# Patient Record
Sex: Female | Born: 1975
Health system: Southern US, Community
[De-identification: ages and names within clinical notes are randomized; demographics above are authoritative.]

## PROBLEM LIST (undated history)

## (undated) DIAGNOSIS — R87619 Unspecified abnormal cytological findings in specimens from cervix uteri: Secondary | ICD-10-CM

## (undated) DIAGNOSIS — F32A Depression, unspecified: Secondary | ICD-10-CM

## (undated) HISTORY — PX: ADENOIDECTOMY: SUR15

---

## 2007-06-25 ENCOUNTER — Inpatient Hospital Stay (HOSPITAL_COMMUNITY): Admission: AD | Admit: 2007-06-25 | Discharge: 2007-06-25 | Payer: Self-pay | Admitting: Obstetrics and Gynecology

## 2007-06-28 ENCOUNTER — Inpatient Hospital Stay (HOSPITAL_COMMUNITY): Admission: AD | Admit: 2007-06-28 | Discharge: 2007-06-30 | Payer: Self-pay | Admitting: Obstetrics and Gynecology

## 2010-08-22 NOTE — H&P (Signed)
NAMEMarland Kitchen  PIXIE, BURGENER NO.:  [id number]   MEDICAL RECORD NO.:  [id number]           PATIENT TYPE:   LOCATION:                                 FACILITY:   PHYSICIAN:  Crist Fat. Rivard, M.D. DATE OF BIRTH:  1975-08-19   DATE OF ADMISSION:  07/06/2007  DATE OF DISCHARGE:                              HISTORY & PHYSICAL   [name] is a 35 year old gravida 2, para 0-0-1-0 at 4 weeks who  presents today for induction secondary to proteinuria.  She was seen at  maternity admissions unit from on office visit on June 25, 2007, with  temporal headache, blurry vision, blood pressure 120/80.  She had a PIH  workup that day that was negative and she was sent home to do a 24-hour  urine and have repeat PIH labs.  PIH labs were negative.  However, she  had a 24-hour urine specimen that showed 535 mg of protein in a 24-hour  specimen.  The complicating factor, however, was that this extended  itself over approximately 36 hours with 1500 mL of urine produced.  Creatinine level in the 24-hour specimen was 1907.  The patient had been  3 cm in the office.  Therefore, she was discussed with Dr. Estanislado Pandy and  the decision was made to admit her for induction secondary to  proteinuria.   Pregnancy has been remarkable for:  1. History of abnormal Pap.  2. History of depression.  3. History of ADD.  4. History of migraines.  5. Previous smoker.  6. First trimester bleeding.  7. Some lability of blood pressure in the third trimester.  8. Now proteinuria on a 24-hour specimen.   PRENATAL LABORATORIES:  Blood type is B positive, Rh antibody negative.  VDRL nonreactive.  Rubella titer positive.  Hepatitis B surface antigen  negative.  HIV was nonreactive.  AFP was normal.  GC and chlamydia  cultures were negative in September.  First trimester screen was also  normal.  Pap showed ASCUS, could not exclude high-grade SIL.  Negative  HPV.  Colposcopy was done at 20 weeks and was normal.   The patient had a  Glucola that was elevated.  She had a normal 3-hour GTT.  PIH labs that  were done at 38 weeks were normal.  She also had another set done on  March 20 that were also normal.  Sickle cell test was negative.  Hemoglobin upon entering the practice was 12.3; it was 13.2 at 28 weeks.   HISTORY OF PRESENT PREGNANCY:  The patient entered care at approximately  13 weeks.  First trimester screen was done.  She had some headache and  vomiting at that time.  She was given Phenergan and Vicodin.  She  continued to have some issues with headache.  She had ASCUS on Pap that  could not exclude high-grade SIL, negative HPV.  Sickle cell was done  and was negative.  Colposcopy was done at 20 weeks and was normal.  She  also had an ultrasound at 18 weeks showing normal growth and an anterior  placenta.  Migraines  continued to be an issue for her throughout her  pregnancy and she was referred to several headache clinics.  However,  due to insurance that was not available to the patient.  She saw her  primary physician, Dr. Reuel Boom, for that.  One-hour Glucola was elevated;  3-hour GTT was normal.  Blood pressure at 34 weeks was 140/80.  She was  having some low back pain.  Group B strep culture done and other  cultures were done at 35-36 weeks and were negative.  On March 18 she  presented to the office with a temporal headache, blurry vision, feeling  disoriented and feels had difficulty speaking.  She also had some  tingling in the right hand.  She was sent to maternity admissions unit  for NST, PIH workup.  These were found to be normal and it was likely  determined this was a migraine variant.  She was seen again in the  office on March 19 and had some PIH labs as well as a 24-hour urine  started.  She returned to the office on March 20.  She did report,  though, that this was more of a 36-hour specimen.  PIH labs were within  normal limits.  Uric acid was less than 5, SGOT/SGPT were  normal, but  her urine sample showed 535 mg of protein.  Her cervix was 3 cm, 50% in  the office.  Therefore, the decision was made to admit her on March 21  for induction.   OBSTETRICAL HISTORY:  In 08/28/05 the patient had a 6-week pregnancy  termination.   MEDICAL HISTORY:  In 28-Aug-1997 she had an abnormal Pap required a colposcopy.  Her Paps had been normal until the first of her pregnancy which showed  an ASCUS which could not exclude high-grade SIL, negative HPV and  negative colposcopy.  She had Trichomonas in 28-Aug-1996.  She had a UTI and  pyelonephritis as a child.  She does have a history of depression after  the death of her husband in 08/28/2001 but she has not been on any medication.  She has also been diagnosed with ADD.  She has been a smoker since 08-28-01.  She has no medication allergies.   SURGICAL HISTORY:  Includes a TAB, wisdom teeth removed in Aug 29, 1995 and  adenoids at age 87.   FAMILY HISTORY:  Her maternal grandmother had hypertension and is now  deceased.  Her maternal grandmother also had anemia.  Maternal  grandmother had non-insulin-dependent diabetes.  Maternal grandmother  had thyroid disease and chronic renal disease.  Paternal grandfather had  Alzheimer's and migraines.   GENETIC HISTORY:  Unremarkable.   SOCIAL HISTORY:  The patient is single.  Father of the baby has been  supportive through the pregnancy.  The patient is [place] of  the [place] faith.  She is high-school educated.  She is employed as a  Scientist, physiological.  Her partner is also high-school educated.  He is a  Community education officer.  The patient has been followed by the physician  service at [facility].  She denies any alcohol, drug or tobacco  use during this pregnancy.   PHYSICAL EXAMINATION:  Blood pressure in the office this past day was  140/86, other vital signs were stable.  HEENT:  Within normal limits.  LUNGS:  Bilateral breath sounds are clear.  HEART:  Regular rate and rhythm  without murmur.  BREASTS:  Soft and nontender.  ABDOMEN:  Fundal height is approximately 38 cm.  Estimated fetal weight  is 7 to 7-and-a-half pounds.  Uterine contractions have been irregular  and mild.  Fetal heart rate has been in the 140s by Doppler.  EXTREMITIES:  Deep tendon reflexes are 2+ without clonus.  There is a 1+  edema noted in the lower extremities.  PELVIC EXAM:  By the physician in the office this week was 3 cm, 50%,  vertex at a -2 station.   IMPRESSION:  1. Intrauterine pregnancy at 39 weeks.  2. Proteinuria on a 24-hour-plus specimen.  3. Some lability of blood pressure in the third trimester.  4. Group B streptococcus negative.   PLAN:  1. Admit to [facility] per consult with Dr. Silverio Lay as attending physician.  2. Routine physician orders for induction.  3. Will repeat PIH labs on admission.  4. Pain medication as needed.  5. Low-dose Pitocin for induction.      Erika Galloway, C.N.M.      Crist Fat Rivard, M.D.  Electronically Signed    VLL/MEDQ  D:  06/28/2007  T:  06/28/2007  Job:  [id number]

## 2011-01-01 LAB — URINALYSIS, ROUTINE W REFLEX MICROSCOPIC
Bilirubin Urine: NEGATIVE
Glucose, UA: NEGATIVE
Glucose, UA: NEGATIVE
Ketones, ur: 15 — AB
Leukocytes, UA: NEGATIVE
Nitrite: NEGATIVE
Protein, ur: 30 — AB
Protein, ur: NEGATIVE
Urobilinogen, UA: 0.2

## 2011-01-01 LAB — CBC
HCT: 28.9 — ABNORMAL LOW
HCT: 34.5 — ABNORMAL LOW
Hemoglobin: 12.2
MCHC: 35
MCHC: 35.3
MCV: 92.6
MCV: 92.8
Platelets: 195
Platelets: 242
RBC: 3.73 — ABNORMAL LOW
RDW: 12.9
WBC: 5.2

## 2011-01-01 LAB — RPR: RPR Ser Ql: NONREACTIVE

## 2011-01-01 LAB — URIC ACID
Uric Acid, Serum: 4.1
Uric Acid, Serum: 4.2

## 2011-01-01 LAB — COMPREHENSIVE METABOLIC PANEL
AST: 13
Alkaline Phosphatase: 106
BUN: 2 — ABNORMAL LOW
CO2: 21
CO2: 21
Calcium: 8.5
Chloride: 109
Creatinine, Ser: 0.54
Creatinine, Ser: 0.57
GFR calc Af Amer: >60
GFR calc non Af Amer: >60
GFR calc non Af Amer: >60
Potassium: 3.6
Total Bilirubin: 0.7

## 2011-01-01 LAB — URINE MICROSCOPIC-ADD ON

## 2018-06-17 DIAGNOSIS — Z6826 Body mass index (BMI) 26.0-26.9, adult: Secondary | ICD-10-CM | POA: Diagnosis not present

## 2018-06-17 DIAGNOSIS — M545 Low back pain: Secondary | ICD-10-CM | POA: Diagnosis not present

## 2018-07-28 DIAGNOSIS — G43919 Migraine, unspecified, intractable, without status migrainosus: Secondary | ICD-10-CM | POA: Diagnosis not present

## 2018-10-22 DIAGNOSIS — G43919 Migraine, unspecified, intractable, without status migrainosus: Secondary | ICD-10-CM | POA: Diagnosis not present

## 2018-10-22 DIAGNOSIS — Z6826 Body mass index (BMI) 26.0-26.9, adult: Secondary | ICD-10-CM | POA: Diagnosis not present

## 2018-10-22 DIAGNOSIS — F331 Major depressive disorder, recurrent, moderate: Secondary | ICD-10-CM | POA: Diagnosis not present

## 2018-10-22 DIAGNOSIS — F900 Attention-deficit hyperactivity disorder, predominantly inattentive type: Secondary | ICD-10-CM | POA: Diagnosis not present

## 2018-10-27 MED FILL — AMPHETAMINE SALTS 30 MG TAB: 30 | 30 days supply | Qty: 60 | Fill #0

## 2018-11-21 MED FILL — AMPHETAMINE SALTS 30 MG TAB: 30 | 30 days supply | Qty: 30 | Fill #0

## 2018-11-21 MED FILL — AMPHETAMINE SALTS 30 MG TAB: 30 | 30 days supply | Qty: 60 | Fill #0

## 2018-11-25 MED FILL — predniSONE 50 MG TABS: 50 | 5 days supply | Qty: 5 | Fill #0

## 2018-12-04 ENCOUNTER — Other Ambulatory Visit: Payer: Self-pay | Admitting: *Deleted

## 2018-12-04 DIAGNOSIS — Z20822 Contact with and (suspected) exposure to covid-19: Secondary | ICD-10-CM

## 2018-12-05 LAB — NOVEL CORONAVIRUS, NAA: SARS-CoV-2, NAA: NOT DETECTED

## 2018-12-08 ENCOUNTER — Encounter: Payer: Self-pay | Admitting: *Deleted

## 2018-12-08 NOTE — Progress Notes (Signed)
Unable to contact patient with covid results. Contact numbers are incorrect

## 2018-12-24 DIAGNOSIS — Z6827 Body mass index (BMI) 27.0-27.9, adult: Secondary | ICD-10-CM | POA: Diagnosis not present

## 2018-12-24 DIAGNOSIS — Z131 Encounter for screening for diabetes mellitus: Secondary | ICD-10-CM | POA: Diagnosis not present

## 2018-12-24 DIAGNOSIS — Z01419 Encounter for gynecological examination (general) (routine) without abnormal findings: Secondary | ICD-10-CM | POA: Diagnosis not present

## 2018-12-24 DIAGNOSIS — Z13220 Encounter for screening for lipoid disorders: Secondary | ICD-10-CM | POA: Diagnosis not present

## 2018-12-24 DIAGNOSIS — Z975 Presence of (intrauterine) contraceptive device: Secondary | ICD-10-CM | POA: Diagnosis not present

## 2018-12-24 DIAGNOSIS — Z1330 Encounter for screening examination for mental health and behavioral disorders, unspecified: Secondary | ICD-10-CM | POA: Diagnosis not present

## 2018-12-24 DIAGNOSIS — Z1239 Encounter for other screening for malignant neoplasm of breast: Secondary | ICD-10-CM | POA: Diagnosis not present

## 2018-12-24 MED FILL — AMPHETAMINE SALTS 30 MG TAB: 30 | 30 days supply | Qty: 30 | Fill #0

## 2018-12-24 MED FILL — AMPHETAMINE SALTS 30 MG TAB: 30 | 30 days supply | Qty: 60 | Fill #0

## 2019-01-21 MED FILL — AMPHETAMINE SALTS 30 MG TAB: 30 | 30 days supply | Qty: 60 | Fill #0

## 2019-01-29 DIAGNOSIS — R87612 Low grade squamous intraepithelial lesion on cytologic smear of cervix (LGSIL): Secondary | ICD-10-CM | POA: Diagnosis not present

## 2019-01-29 DIAGNOSIS — Z6826 Body mass index (BMI) 26.0-26.9, adult: Secondary | ICD-10-CM | POA: Diagnosis not present

## 2019-01-29 DIAGNOSIS — F331 Major depressive disorder, recurrent, moderate: Secondary | ICD-10-CM | POA: Diagnosis not present

## 2019-01-29 DIAGNOSIS — G43919 Migraine, unspecified, intractable, without status migrainosus: Secondary | ICD-10-CM | POA: Diagnosis not present

## 2019-02-04 ENCOUNTER — Other Ambulatory Visit (HOSPITAL_COMMUNITY)
Admission: RE | Admit: 2019-02-04 | Discharge: 2019-02-04 | Disposition: A | Discharge status: Home / Self Care | Payer: 59 | Source: Ambulatory Visit | Attending: Obstetrics and Gynecology | Admitting: Obstetrics and Gynecology

## 2019-02-04 DIAGNOSIS — Z01419 Encounter for gynecological examination (general) (routine) without abnormal findings: Secondary | ICD-10-CM | POA: Insufficient documentation

## 2019-02-06 ENCOUNTER — Other Ambulatory Visit (HOSPITAL_COMMUNITY)
Admission: RE | Admit: 2019-02-06 | Discharge: 2019-02-06 | Disposition: A | Discharge status: Home / Self Care | Payer: 59 | Source: Ambulatory Visit | Attending: Obstetrics and Gynecology | Admitting: Obstetrics and Gynecology

## 2019-02-06 DIAGNOSIS — Z01419 Encounter for gynecological examination (general) (routine) without abnormal findings: Secondary | ICD-10-CM | POA: Insufficient documentation

## 2019-02-06 LAB — LIPID PANEL
Cholesterol: 249 mg/dL — ABNORMAL HIGH (ref 0–200)
HDL: 90 mg/dL (ref >40)
LDL Cholesterol: 124 mg/dL — ABNORMAL HIGH (ref 0–99)
Total CHOL/HDL Ratio: 2.8 RATIO
Triglycerides: 177 mg/dL — ABNORMAL HIGH (ref <150)
VLDL: 35 mg/dL (ref 0–40)

## 2019-02-06 LAB — HEMOGLOBIN A1C
Hgb A1c MFr Bld: 4.7 % — ABNORMAL LOW (ref 4.8–5.6)
Mean Plasma Glucose: 88.19 mg/dL

## 2019-02-06 LAB — TSH: TSH: 1.098 u[IU]/mL (ref 0.350–4.500)

## 2019-02-12 MED FILL — AMPHETAMINE SALTS 30 MG TAB: 30 | 30 days supply | Qty: 30 | Fill #0

## 2019-02-20 MED FILL — AMPHETAMINE SALTS 30 MG TAB: 30 | 60 days supply | Qty: 60 | Fill #0

## 2019-03-12 MED FILL — AMPHETAMINE SALTS 30 MG TAB: 30 | 90 days supply | Qty: 90 | Fill #0

## 2019-04-14 MED FILL — AMPHETAMINE SALTS 30 MG TAB: 30 | 30 days supply | Qty: 60 | Fill #0

## 2019-04-28 ENCOUNTER — Other Ambulatory Visit (HOSPITAL_COMMUNITY): Payer: Self-pay | Admitting: Family Medicine

## 2019-04-28 ENCOUNTER — Other Ambulatory Visit (HOSPITAL_COMMUNITY): Payer: Self-pay | Admitting: Obstetrics and Gynecology

## 2019-04-28 DIAGNOSIS — Z1231 Encounter for screening mammogram for malignant neoplasm of breast: Secondary | ICD-10-CM

## 2019-05-04 ENCOUNTER — Other Ambulatory Visit: Payer: Self-pay

## 2019-05-04 ENCOUNTER — Ambulatory Visit (HOSPITAL_COMMUNITY)
Admission: RE | Admit: 2019-05-04 | Discharge: 2019-05-04 | Disposition: A | Discharge status: Home / Self Care | Payer: 59 | Source: Ambulatory Visit | Attending: Obstetrics and Gynecology | Admitting: Obstetrics and Gynecology

## 2019-05-04 DIAGNOSIS — Z1231 Encounter for screening mammogram for malignant neoplasm of breast: Secondary | ICD-10-CM | POA: Insufficient documentation

## 2019-05-05 ENCOUNTER — Other Ambulatory Visit (HOSPITAL_COMMUNITY): Payer: Self-pay | Admitting: Obstetrics and Gynecology

## 2019-05-05 DIAGNOSIS — F331 Major depressive disorder, recurrent, moderate: Secondary | ICD-10-CM | POA: Diagnosis not present

## 2019-05-05 DIAGNOSIS — F900 Attention-deficit hyperactivity disorder, predominantly inattentive type: Secondary | ICD-10-CM | POA: Diagnosis not present

## 2019-05-05 DIAGNOSIS — G43919 Migraine, unspecified, intractable, without status migrainosus: Secondary | ICD-10-CM | POA: Diagnosis not present

## 2019-05-06 ENCOUNTER — Other Ambulatory Visit (HOSPITAL_COMMUNITY): Payer: Self-pay | Admitting: Obstetrics and Gynecology

## 2019-05-06 DIAGNOSIS — R928 Other abnormal and inconclusive findings on diagnostic imaging of breast: Secondary | ICD-10-CM

## 2019-05-19 ENCOUNTER — Ambulatory Visit (HOSPITAL_COMMUNITY)
Admission: RE | Admit: 2019-05-19 | Discharge: 2019-05-19 | Disposition: A | Discharge status: Home / Self Care | Payer: 59 | Source: Ambulatory Visit | Attending: Obstetrics and Gynecology | Admitting: Obstetrics and Gynecology

## 2019-05-19 ENCOUNTER — Other Ambulatory Visit: Payer: Self-pay

## 2019-05-19 DIAGNOSIS — N6321 Unspecified lump in the left breast, upper outer quadrant: Secondary | ICD-10-CM | POA: Diagnosis not present

## 2019-05-19 DIAGNOSIS — R928 Other abnormal and inconclusive findings on diagnostic imaging of breast: Secondary | ICD-10-CM | POA: Diagnosis not present

## 2019-06-10 MED FILL — AMPHETAMINE SALTS 30 MG TAB: 30 | 90 days supply | Qty: 180 | Fill #0

## 2019-06-10 MED FILL — AMPHETAMINE SALTS 30 MG TAB: 30 | 90 days supply | Qty: 90 | Fill #0

## 2019-07-22 MED FILL — PENICILLIN VK 500 MG TABLET: 500 | 8 days supply | Qty: 30 | Fill #0

## 2019-09-24 DIAGNOSIS — G43919 Migraine, unspecified, intractable, without status migrainosus: Secondary | ICD-10-CM | POA: Diagnosis not present

## 2019-09-24 DIAGNOSIS — F331 Major depressive disorder, recurrent, moderate: Secondary | ICD-10-CM | POA: Diagnosis not present

## 2019-09-24 DIAGNOSIS — F900 Attention-deficit hyperactivity disorder, predominantly inattentive type: Secondary | ICD-10-CM | POA: Diagnosis not present

## 2019-09-24 DIAGNOSIS — Z6826 Body mass index (BMI) 26.0-26.9, adult: Secondary | ICD-10-CM | POA: Diagnosis not present

## 2019-09-24 MED FILL — AMPHETAMINE-DEXTROAMPHETAMI: 10 | 90 days supply | Qty: 90 | Fill #0

## 2019-09-24 MED FILL — AMPHETAMINE SALTS 30 MG TAB: 30 | 90 days supply | Qty: 180 | Fill #0

## 2019-09-24 MED FILL — ESCITALOPRAM 10 MG TABLET: 10 | 30 days supply | Qty: 30 | Fill #0

## 2020-01-05 DIAGNOSIS — R202 Paresthesia of skin: Secondary | ICD-10-CM | POA: Diagnosis not present

## 2020-01-25 ENCOUNTER — Other Ambulatory Visit (HOSPITAL_COMMUNITY): Payer: Self-pay | Admitting: Physician Assistant

## 2020-01-25 DIAGNOSIS — F900 Attention-deficit hyperactivity disorder, predominantly inattentive type: Secondary | ICD-10-CM | POA: Diagnosis not present

## 2020-01-25 DIAGNOSIS — G43919 Migraine, unspecified, intractable, without status migrainosus: Secondary | ICD-10-CM | POA: Diagnosis not present

## 2020-01-25 DIAGNOSIS — Z6828 Body mass index (BMI) 28.0-28.9, adult: Secondary | ICD-10-CM | POA: Diagnosis not present

## 2020-01-25 DIAGNOSIS — Z975 Presence of (intrauterine) contraceptive device: Secondary | ICD-10-CM | POA: Diagnosis not present

## 2020-01-25 DIAGNOSIS — N6320 Unspecified lump in the left breast, unspecified quadrant: Secondary | ICD-10-CM | POA: Diagnosis not present

## 2020-01-25 DIAGNOSIS — Z01419 Encounter for gynecological examination (general) (routine) without abnormal findings: Secondary | ICD-10-CM | POA: Diagnosis not present

## 2020-01-25 DIAGNOSIS — Z6827 Body mass index (BMI) 27.0-27.9, adult: Secondary | ICD-10-CM | POA: Diagnosis not present

## 2020-01-25 DIAGNOSIS — F331 Major depressive disorder, recurrent, moderate: Secondary | ICD-10-CM | POA: Diagnosis not present

## 2020-01-25 DIAGNOSIS — Z1151 Encounter for screening for human papillomavirus (HPV): Secondary | ICD-10-CM | POA: Diagnosis not present

## 2020-01-25 MED FILL — AMPHETAMINE SALTS 30 MG TAB: 30 | 90 days supply | Qty: 180 | Fill #0

## 2020-01-28 DIAGNOSIS — Z20828 Contact with and (suspected) exposure to other viral communicable diseases: Secondary | ICD-10-CM | POA: Diagnosis not present

## 2020-02-02 DIAGNOSIS — Z20828 Contact with and (suspected) exposure to other viral communicable diseases: Secondary | ICD-10-CM | POA: Diagnosis not present

## 2020-02-10 ENCOUNTER — Other Ambulatory Visit (HOSPITAL_COMMUNITY): Payer: Self-pay | Admitting: Obstetrics and Gynecology

## 2020-02-10 DIAGNOSIS — N6320 Unspecified lump in the left breast, unspecified quadrant: Secondary | ICD-10-CM

## 2020-02-12 DIAGNOSIS — H5213 Myopia, bilateral: Secondary | ICD-10-CM | POA: Diagnosis not present

## 2020-03-21 DIAGNOSIS — R87613 High grade squamous intraepithelial lesion on cytologic smear of cervix (HGSIL): Secondary | ICD-10-CM | POA: Diagnosis not present

## 2020-03-21 DIAGNOSIS — N871 Moderate cervical dysplasia: Secondary | ICD-10-CM | POA: Diagnosis not present

## 2020-03-29 ENCOUNTER — Ambulatory Visit (HOSPITAL_COMMUNITY)
Admission: RE | Admit: 2020-03-29 | Discharge: 2020-03-29 | Disposition: A | Discharge status: Home / Self Care | Payer: 59 | Source: Ambulatory Visit | Attending: Obstetrics and Gynecology | Admitting: Obstetrics and Gynecology

## 2020-03-29 ENCOUNTER — Other Ambulatory Visit (HOSPITAL_COMMUNITY): Payer: Self-pay | Admitting: Obstetrics and Gynecology

## 2020-03-29 ENCOUNTER — Other Ambulatory Visit: Payer: Self-pay

## 2020-03-29 DIAGNOSIS — N6320 Unspecified lump in the left breast, unspecified quadrant: Secondary | ICD-10-CM

## 2020-03-29 DIAGNOSIS — R928 Other abnormal and inconclusive findings on diagnostic imaging of breast: Secondary | ICD-10-CM

## 2020-03-29 DIAGNOSIS — N6321 Unspecified lump in the left breast, upper outer quadrant: Secondary | ICD-10-CM | POA: Diagnosis not present

## 2020-03-29 DIAGNOSIS — N6001 Solitary cyst of right breast: Secondary | ICD-10-CM | POA: Diagnosis not present

## 2020-04-07 ENCOUNTER — Other Ambulatory Visit: Payer: Self-pay

## 2020-04-07 ENCOUNTER — Ambulatory Visit (HOSPITAL_COMMUNITY)
Admission: RE | Admit: 2020-04-07 | Discharge: 2020-04-07 | Disposition: A | Discharge status: Home / Self Care | Payer: 59 | Source: Ambulatory Visit | Attending: Obstetrics and Gynecology | Admitting: Obstetrics and Gynecology

## 2020-04-07 ENCOUNTER — Other Ambulatory Visit (HOSPITAL_COMMUNITY): Payer: Self-pay | Admitting: Obstetrics and Gynecology

## 2020-04-07 ENCOUNTER — Encounter (HOSPITAL_COMMUNITY): Payer: Self-pay

## 2020-04-07 DIAGNOSIS — R928 Other abnormal and inconclusive findings on diagnostic imaging of breast: Secondary | ICD-10-CM

## 2020-04-07 DIAGNOSIS — N6321 Unspecified lump in the left breast, upper outer quadrant: Secondary | ICD-10-CM | POA: Diagnosis not present

## 2020-04-07 DIAGNOSIS — D0512 Intraductal carcinoma in situ of left breast: Secondary | ICD-10-CM | POA: Diagnosis not present

## 2020-04-07 DIAGNOSIS — D242 Benign neoplasm of left breast: Secondary | ICD-10-CM | POA: Diagnosis not present

## 2020-04-07 MED ORDER — SODIUM BICARBONATE 4.2 % IV SOLN
INTRAVENOUS | Status: AC
  Administered 2020-04-07: 2.5 meq
  Filled 2020-04-07: qty 10

## 2020-04-07 MED ORDER — LIDOCAINE HCL (PF) 1 % IJ SOLN
10.0000 mL | Freq: Once | INTRAMUSCULAR | Status: AC
  Administered 2020-04-07: 10 mL via INTRADERMAL

## 2020-04-07 MED ORDER — LIDOCAINE-EPINEPHRINE (PF) 1 %-1:200000 IJ SOLN
10.0000 mL | Freq: Once | INTRAMUSCULAR | Status: AC
  Administered 2020-04-07: 10 mL via INTRADERMAL

## 2020-04-07 NOTE — Discharge Instructions (Signed)
Breast Biopsy, Care After These instructions give you information about caring for yourself after your procedure. Your doctor may also give you more specific instructions. Call your doctor if you have any problems or questions after your procedure. What can I expect after the procedure? After your procedure, it is common to have:  Bruising on your breast.  Numbness, tingling, or pain near your biopsy site. Follow these instructions at home: Medicines  Take over-the-counter and prescription medicines only as told by your doctor.  Do not drive for 24 hours if you were given a medicine to help you relax (sedative) during your procedure.  Do not drink alcohol while taking pain medicine.  Do not drive or use heavy machinery while taking prescription pain medicine. Biopsy site care      Follow instructions from your doctor about how to take care of your cut from surgery (incision) or your puncture area. Make sure you: ? Wash your hands with soap and water before you change your bandage (dressing). If you cannot use soap and water, use hand sanitizer. ? Change your bandage as told by your doctor. ? Leave stitches (sutures), skin glue, or skin tape (adhesive strips) in place. They may need to stay in place for 2 weeks or longer. If tape strips get loose and curl up, you may trim the loose edges. Do not remove tape strips completely unless your doctor says it is okay.  If you have stitches, keep them dry when you take a bath or a shower.  Check your cut or puncture area every day for signs of infection. Check for: ? Redness, swelling, or pain. ? Fluid or blood. ? Warmth. ? Pus or a bad smell.  Protect the biopsy area. Do not let the area get bumped. Activity  If you had a cut during your procedure, avoid activities that could pull your cut open. These include: ? Stretching. ? Reaching over your head. ? Exercise. ? Sports. ? Lifting anything that weighs more than 3 lb (1.4  kg).  Return to your normal activities as told by your doctor. Ask your doctor what activities are safe for you. Managing pain, stiffness, and swelling If told, put ice on the biopsy site to relieve swelling:  Put ice in a plastic bag.  Place a towel between your skin and the bag.  Leave the ice on for 20 minutes, 2-3 times a day. General instructions  Continue your normal diet.  Wear a good support bra for as long as told by your doctor.  Get checked for extra fluid around your lymph nodes (lymphedema) as often as told by your doctor.  Keep all follow-up visits as told by your doctor. This is important. Contact a doctor if:  You notice any of the following at the biopsy site: ? More redness, swelling, or pain. ? More fluid or blood coming from the site. ? The site feels warm to the touch. ? Pus or a bad smell coming from the site. ? The site breaks open after the stitches or skin tape strips have been removed.  You have a rash.  You have a fever. Get help right away if:  You have more bleeding from the biopsy site. Get help right away if bleeding is more than a small spot.  You have trouble breathing.  You have red streaks around the biopsy site. Summary  After your procedure, it is common to have bruising, numbness, tingling, or pain near the biopsy site.  Do not drive   or use heavy machinery while taking prescription pain medicine.  Wear a good support bra for as long as told by your doctor.  If you had a cut during your procedure, avoid activities that may pull the cut open. Ask your doctor what activities are safe for you. This information is not intended to replace advice given to you by your health care provider. Make sure you discuss any questions you have with your health care provider. Document Revised: 09/12/2017 Document Reviewed: 09/12/2017 Elsevier Patient Education  2020 Elsevier Inc.  

## 2020-04-07 NOTE — Sedation Documentation (Signed)
PT tolerated left breast biopsy well today with NAD noted. PT verbalized understanding of discharge instructions. PT ambulated back to the mammogram area this time.  

## 2020-04-11 LAB — SURGICAL PATHOLOGY

## 2020-04-19 DIAGNOSIS — Z8616 Personal history of COVID-19: Secondary | ICD-10-CM

## 2020-04-19 HISTORY — DX: Personal history of COVID-19: Z86.16

## 2020-04-20 ENCOUNTER — Other Ambulatory Visit (HOSPITAL_COMMUNITY): Payer: Self-pay | Admitting: Physician Assistant

## 2020-04-20 MED FILL — DEXTROAMP-AMPHETAMIN 20 MG: 20 | 90 days supply | Qty: 90 | Fill #0

## 2020-05-03 ENCOUNTER — Other Ambulatory Visit (HOSPITAL_COMMUNITY): Payer: Self-pay | Admitting: General Surgery

## 2020-05-03 ENCOUNTER — Ambulatory Visit: Payer: Self-pay | Admitting: General Surgery

## 2020-05-03 DIAGNOSIS — D0502 Lobular carcinoma in situ of left breast: Secondary | ICD-10-CM

## 2020-05-06 ENCOUNTER — Other Ambulatory Visit: Payer: Self-pay | Admitting: General Surgery

## 2020-05-06 DIAGNOSIS — D0502 Lobular carcinoma in situ of left breast: Secondary | ICD-10-CM

## 2020-05-09 ENCOUNTER — Other Ambulatory Visit (HOSPITAL_COMMUNITY): Payer: Self-pay | Admitting: Obstetrics & Gynecology

## 2020-05-09 MED FILL — metroNIDAZOLE 500 MG TABS: 500 | 7 days supply | Qty: 14 | Fill #0

## 2020-05-11 ENCOUNTER — Ambulatory Visit (HOSPITAL_COMMUNITY)
Admission: RE | Admit: 2020-05-11 | Discharge: 2020-05-11 | Disposition: A | Discharge status: Home / Self Care | Payer: No Typology Code available for payment source | Source: Ambulatory Visit | Attending: General Surgery | Admitting: General Surgery

## 2020-05-11 ENCOUNTER — Other Ambulatory Visit: Payer: Self-pay

## 2020-05-11 DIAGNOSIS — D0502 Lobular carcinoma in situ of left breast: Secondary | ICD-10-CM | POA: Diagnosis present

## 2020-05-11 MED ORDER — GADOBUTROL 1 MMOL/ML IV SOLN
7.0000 mL | Freq: Once | INTRAVENOUS | Status: AC | PRN
  Administered 2020-05-11: 7 mL via INTRAVENOUS

## 2020-05-16 ENCOUNTER — Other Ambulatory Visit: Payer: Self-pay

## 2020-05-16 ENCOUNTER — Encounter (HOSPITAL_BASED_OUTPATIENT_CLINIC_OR_DEPARTMENT_OTHER): Payer: Self-pay | Admitting: General Surgery

## 2020-05-17 ENCOUNTER — Encounter (HOSPITAL_COMMUNITY): Payer: Self-pay | Admitting: Hematology

## 2020-05-17 ENCOUNTER — Inpatient Hospital Stay (HOSPITAL_COMMUNITY): Payer: No Typology Code available for payment source | Attending: Hematology | Admitting: Hematology

## 2020-05-17 VITALS — BP 130/82 | HR 88 | Temp 97.2°F | Resp 17 | Ht 66.0 in | Wt 174.4 lb

## 2020-05-17 DIAGNOSIS — Z79899 Other long term (current) drug therapy: Secondary | ICD-10-CM | POA: Insufficient documentation

## 2020-05-17 DIAGNOSIS — Z8616 Personal history of COVID-19: Secondary | ICD-10-CM | POA: Diagnosis not present

## 2020-05-17 DIAGNOSIS — F32A Depression, unspecified: Secondary | ICD-10-CM | POA: Insufficient documentation

## 2020-05-17 DIAGNOSIS — D0502 Lobular carcinoma in situ of left breast: Secondary | ICD-10-CM | POA: Insufficient documentation

## 2020-05-17 NOTE — Progress Notes (Signed)
[facility] [address], [place] [address]   Patient Care Team: Caryl Bis, MD as PCP - General (Family Medicine)  CHIEF COMPLAINTS/PURPOSE OF CONSULTATION:  Newly diagnosed left breast cancer  HISTORY OF PRESENTING ILLNESS:  Erika Galloway 45 y.o. female is here because of recent diagnosis of left breast lobular carcinoma in situ, at the request of Dr. Autumn Messing. She is scheduled for a lumpectomy on 05/23/2020. She had a screening mammogram which showed an 8 mm area of distortion in upper outer quadrant of left breast.  Today she reports feeling well. This is her first breast biopsy and did not feel the mass prior to the mammogram. She denies having any changes in her overall health recently. She is still menstruating.  She denies any family history of cancer on her mother's side; she is not in touch with her father's side of the family.  I reviewed her records extensively and collaborated the history with the patient.  SUMMARY OF ONCOLOGIC HISTORY: Oncology History   No history exists.    In terms of breast cancer risk profile:  She menarched at early age of 44 and is still menstruating.  She had 2 pregnancies, her first child was born at age 19.  She has received birth control pills for approximately 10 years with IUD (Paragard) .  She was never exposed to fertility medications or hormone replacement therapy.  She has negative family history of Breast/GYN/GI cancer.  MEDICAL HISTORY:  Past Medical History:  Diagnosis Date  . Abnormal Pap smear of cervix   . Depression   . History of COVID-19 04/19/2020    SURGICAL HISTORY: Past Surgical History:  Procedure Laterality Date  . ADENOIDECTOMY      SOCIAL HISTORY: Social History   Socioeconomic History  . Marital status: Married    Spouse name: Not on file  . Number of children: Not on file  . Years of education: Not on file  . Highest education level: Not on file  Occupational History   . Not on file  Tobacco Use  . Smoking status: Never Smoker  . Smokeless tobacco: Never Used  Vaping Use  . Vaping Use: Never used  Substance and Sexual Activity  . Alcohol use: Never  . Drug use: Never  . Sexual activity: Not on file  Other Topics Concern  . Not on file  Social History Narrative  . Not on file   Social Determinants of Health   Financial Resource Strain: Low Risk   . Difficulty of Paying Living Expenses: Not very hard  Food Insecurity: No Food Insecurity  . Worried About Charity fundraiser in the Last Year: Never true  . Ran Out of Food in the Last Year: Never true  Transportation Needs: No Transportation Needs  . Lack of Transportation (Medical): No  . Lack of Transportation (Non-Medical): No  Physical Activity: Inactive  . Days of Exercise per Week: 0 days  . Minutes of Exercise per Session: 0 min  Stress: No Stress Concern Present  . Feeling of Stress : Only a little  Social Connections: Moderately Integrated  . Frequency of Communication with Friends and Family: More than three times a week  . Frequency of Social Gatherings with Friends and Family: Three times a week  . Attends Religious Services: 1 to 4 times per year  . Active Member of Clubs or Organizations: No  . Attends Archivist Meetings: Never  . Marital Status: Married  Intimate Partner Violence: Not At Risk  . Fear of Current or Ex-Partner: No  . Emotionally Abused: No  . Physically Abused: No  . Sexually Abused: No    FAMILY HISTORY: No family history on file.  ALLERGIES:  has No Known Allergies.  MEDICATIONS:  Current Outpatient Medications  Medication Sig Dispense Refill  . amphetamine-dextroamphetamine (ADDERALL) 30 MG tablet     . metroNIDAZOLE (FLAGYL) 500 MG tablet Take 500 mg by mouth 2 (two) times daily.     No current facility-administered medications for this visit.    REVIEW OF SYSTEMS:   Review of Systems  Constitutional: Positive for fatigue (90%).  Negative for appetite change.  All other systems reviewed and are negative.   PHYSICAL EXAMINATION: ECOG PERFORMANCE STATUS: 0 - Asymptomatic  Vitals:   05/17/20 0822  BP: 130/82  Pulse: 88  Resp: 17  Temp: (!) 97.2 F (36.2 C)  SpO2: 100%   Filed Weights   05/17/20 0822  Weight: 174 lb 6.4 oz (79.1 kg)   Physical Exam Vitals reviewed.  Constitutional:      Appearance: Normal appearance.  Cardiovascular:     Rate and Rhythm: Normal rate and regular rhythm.     Pulses: Normal pulses.     Heart sounds: Normal heart sounds.  Pulmonary:     Effort: Pulmonary effort is normal.     Breath sounds: Normal breath sounds.  Chest:  Breasts:     Right: Normal. No swelling, bleeding, inverted nipple, mass, nipple discharge, skin change, tenderness, axillary adenopathy or supraclavicular adenopathy.     Left: Normal. No swelling, bleeding, inverted nipple, mass, nipple discharge, skin change, tenderness, axillary adenopathy or supraclavicular adenopathy.    Abdominal:     Palpations: Abdomen is soft. There is no hepatomegaly or mass.     Tenderness: There is no abdominal tenderness.  Musculoskeletal:     Right lower leg: No edema.     Left lower leg: No edema.  Lymphadenopathy:     Upper Body:     Right upper body: No supraclavicular, axillary or pectoral adenopathy.     Left upper body: No supraclavicular, axillary or pectoral adenopathy.  Neurological:     General: No focal deficit present.     Mental Status: She is alert and oriented to person, place, and time.  Psychiatric:        Mood and Affect: Mood normal.        Behavior: Behavior normal.      LABORATORY DATA:  I have reviewed the data as listed Recent Results (from the past 2160 hour(s))  Surgical pathology     Status: None   Collection Time: 04/07/20  1:44 PM  Result Value Ref Range   SURGICAL PATHOLOGY      SURGICAL PATHOLOGY CASE: [id number] PATIENT: Erika Galloway Surgical Pathology  Report     Clinical History: 8 mm  oval mass     FINAL MICROSCOPIC DIAGNOSIS:  A. BREAST, 1:00, MASS, LEFT, BIOPSY: - Lobular carcinoma in situ, involving a fibroadenoma, see comment     COMMENT:  LCIS measures 0.6 cm in greatest linear dimension.  Dr. Tresa Goldfarb reviewed the case and concurs with the diagnosis.  Erika Galloway was notified on 04/11/2020.    GROSS DESCRIPTION:  A: Received in formalin, time in formalin 1:30 PM and cold ischemic time less than 1 minute, are 5 cores of soft tan-yellow tissue which measure 1.5 to 1.7 cm in length and each 0.2 cm in diameter.  The  specimen is submitted in toto.  [facility] 04/07/2020)   Final Diagnosis performed by Jaquita Folds, MD.   Electronically signed 04/11/2020 Technical component performed at [facility] [address] [place], [place] [address].  Professional component performed at [facility], [place] [address], [place], [place] [address].  Immunohistochemistry Technical component (if applicable) was performed at [facility]. [address] [place], [place], [place] [address].   IMMUNOHISTOCHEMISTRY DISCLAIMER (if applicable): Some of these immunohistochemical stains may have been developed and the performance characteristics determine by [facility]. Some may not have been cleared or approved by the U.S. Food and Drug Administration. The FDA has determined that such clearance or approval is not necessary. This test is used for clinical purposes. It should not be regarded as investigational or for research. This laboratory is certified under the [place] (CLIA-88) as qualified to perform high complexity clinical laboratory testing.  The controls stained appropriately.     RADIOGRAPHIC STUDIES: I have personally reviewed the radiological reports and agreed with the findings in the report. MR BREAST  BILATERAL W Tiro CAD  Result Date: 05/12/2020 CLINICAL DATA:  Lobular carcinoma in situ involving a fibroadenoma in the 1 o'clock position of the left breast on ultrasound-guided core needle biopsy performed on 04/07/2020. Bilateral breast MRI and high risk screening MRI annually thereafter was recommended due to the patient's diagnosis of LCIS, which carries an elevated risk for breast cancer development in either breast. LABS:  None obtained on site today. EXAM: BILATERAL BREAST MRI WITH AND WITHOUT CONTRAST TECHNIQUE: Multiplanar, multisequence MR images of both breasts were obtained prior to and following the intravenous administration of 7 ml of Gadavist Three-dimensional MR images were rendered by post-processing of the original MR data on an independent workstation. The three-dimensional MR images were interpreted, and findings are reported in the following complete MRI report for this study. Three dimensional images were evaluated at the independent interpreting workstation using the DynaCAD thin client. COMPARISON:  Bilateral diagnostic mammogram and bilateral breast ultrasound dated 03/29/2020. Left breast ultrasound-guided core needle biopsy and post biopsy mammogram dated 04/07/2020. FINDINGS: Breast composition: c. Heterogeneous fibroglandular tissue. Background parenchymal enhancement: Mild. Right breast: No mass or abnormal enhancement. Left breast: 6 x 4 mm oval, enhancing mass in the posterior upper outer left breast on image number 81 series 8. This contains a biopsy marker clip artifact and corresponds to the recently biopsied fibroadenoma containing lobular carcinoma in situ. This has a mixture of enhancement kinetics, including a small amount of rapid wash-in/washout. No additional masses or areas of enhancement elsewhere in the breast suspicious for malignancy. Lymph nodes: No abnormal appearing lymph nodes. Ancillary findings:  None. IMPRESSION: 1. 6 mm recently biopsied  fibroadenoma containing LCIS in the 1 o'clock position of the left breast. 2. No evidence of malignancy elsewhere in either breast. RECOMMENDATION: 1. Consider surgical excision of the 6 mm recently biopsied fibroadenoma containing LCIS in the 1 o'clock position of the left breast. 2. Bilateral screening mammogram in 11 months. 3. Bilateral screening MRI in 1 year. BI-RADS CATEGORY  2: Benign. Electronically Signed   By: Claudie Revering M.D.   On: 05/12/2020 16:21     ASSESSMENT:  1.  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 -Left breast biopsy, 1 o'clock position on 04/07/2020-LCIS involving a fibroadenoma. -MRI of the breast on 05/11/2020 with 6 mm enhancing mass in the posterior upper outer left breast.  No evidence of malignant cells within the breast.  No axillary lymph nodes.  2.  Social/family history: -She works as a Tourist information centre manager at [employer]. 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  No malignancies on her mother side.    PLAN:  1.  LCIS of the upper outer quadrant of left breast: -We discussed the pathology report in detail. -We discussed normal management of LCIS with surgical excision.  We also discussed the increased risk of invasive breast cancer associated with LCIS. -She will proceed with lumpectomy by Dr. Marlou Starks on 05/23/2020. -She will come back in 4 weeks after surgery.  We will discuss the pathology in detail. -Continue active surveillance with yearly mammogram. -She will be offered chemoprevention with tamoxifen.    All questions were answered. The patient knows to call the clinic with any problems, questions or concerns.   Derek Jack, MD 05/17/20 9:13 AM  [place] [phone]   I, Milinda Antis, am acting as a scribe for Dr. Sanda Linger.  I, Derek Jack MD, have reviewed the above documentation for accuracy and completeness, and I agree with the above.

## 2020-05-17 NOTE — Patient Instructions (Signed)
[place] at [facility] Discharge Instructions  You were seen and examined today by Dr. Delton Coombes. Dr. Delton Coombes is a medical oncologist, meaning he specializes in the management of cancer diagnoses with medications. Dr. Delton Coombes discussed your past medical history, family history of cancer and the events that led to you being here today.  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 This is a precancer that arises in the lobules at the end of the milk ducts. This type of precancer is almost always fed by estrogen. If left untreated, this can become invasive caner. You should proceed with surgery as planned, the best course of treatment is surgical removal. Assuming that the lumpectomy reveals that the precancer is estrogen receptor positive, the recommended course of treatment is at least 5 years of antiestrogen therapy. This would decrease the risk of developing cancer in the next 5 to 10 years.   You will need to be monitored regularly with mammograms and MRIs as needed.  We will see you back 4 weeks after surgery.   Thank you for choosing [place] at [facility] to provide your oncology and hematology care.  To afford each patient quality time with our provider, please arrive at least 15 minutes before your scheduled appointment time.   If you have a lab appointment with the [facility] please come in thru the Main Entrance and check in at the main information desk.  You need to re-schedule your appointment should you arrive 10 or more minutes late.  We strive to give you quality time with our providers, and arriving late affects you and other patients whose appointments are after yours.  Also, if you no show three or more times for appointments you may be dismissed from the clinic at the providers discretion.     Again, thank you for choosing [facility].  Our hope is that these requests will decrease  the amount of time that you wait before being seen by our physicians.       _____________________________________________________________  Should you have questions after your visit to [facility], please contact our office at [phone] and follow the prompts.  Our office hours are 8:00 a.m. and 4:30 p.m. Monday - Friday.  Please note that voicemails left after 4:00 p.m. may not be returned until the following business day.  We are closed weekends and major holidays.  You do have access to a nurse 24-7, just call the main number to the clinic [phone] and do not press any options, hold on the line and a nurse will answer the phone.    For prescription refill requests, have your pharmacy contact our office and allow 72 hours.    Due to Covid, you will need to wear a mask upon entering the hospital. If you do not have a mask, a mask will be given to you at the Main Entrance upon arrival. For doctor visits, patients may have 1 support person age 58 or older with them. For treatment visits, patients can not have anyone with them due to social distancing guidelines and our immunocompromised population.

## 2020-05-19 ENCOUNTER — Other Ambulatory Visit (HOSPITAL_COMMUNITY): Payer: No Typology Code available for payment source

## 2020-05-23 ENCOUNTER — Ambulatory Visit
Admission: RE | Admit: 2020-05-23 | Discharge: 2020-05-23 | Disposition: A | Discharge status: Home / Self Care | Payer: No Typology Code available for payment source | Source: Ambulatory Visit | Attending: General Surgery | Admitting: General Surgery

## 2020-05-23 ENCOUNTER — Other Ambulatory Visit (HOSPITAL_BASED_OUTPATIENT_CLINIC_OR_DEPARTMENT_OTHER): Payer: Self-pay | Admitting: General Surgery

## 2020-05-23 ENCOUNTER — Ambulatory Visit (HOSPITAL_BASED_OUTPATIENT_CLINIC_OR_DEPARTMENT_OTHER)
Admission: RE | Admit: 2020-05-23 | Discharge: 2020-05-23 | Disposition: A | Discharge status: Home / Self Care | Payer: No Typology Code available for payment source | Attending: General Surgery | Admitting: General Surgery

## 2020-05-23 ENCOUNTER — Encounter (HOSPITAL_BASED_OUTPATIENT_CLINIC_OR_DEPARTMENT_OTHER)
Admission: RE | Disposition: A | Discharge status: Home / Self Care | Payer: Self-pay | Source: Home / Self Care | Attending: General Surgery

## 2020-05-23 ENCOUNTER — Ambulatory Visit (HOSPITAL_BASED_OUTPATIENT_CLINIC_OR_DEPARTMENT_OTHER): Payer: No Typology Code available for payment source | Admitting: Anesthesiology

## 2020-05-23 ENCOUNTER — Other Ambulatory Visit: Payer: Self-pay

## 2020-05-23 DIAGNOSIS — Z87891 Personal history of nicotine dependence: Secondary | ICD-10-CM | POA: Insufficient documentation

## 2020-05-23 DIAGNOSIS — Z8616 Personal history of COVID-19: Secondary | ICD-10-CM | POA: Diagnosis not present

## 2020-05-23 DIAGNOSIS — D0502 Lobular carcinoma in situ of left breast: Secondary | ICD-10-CM

## 2020-05-23 DIAGNOSIS — D242 Benign neoplasm of left breast: Secondary | ICD-10-CM | POA: Diagnosis not present

## 2020-05-23 DIAGNOSIS — D0592 Unspecified type of carcinoma in situ of left breast: Secondary | ICD-10-CM | POA: Insufficient documentation

## 2020-05-23 HISTORY — DX: Depression, unspecified: F32.A

## 2020-05-23 HISTORY — PX: BREAST LUMPECTOMY WITH RADIOACTIVE SEED LOCALIZATION: SHX6424

## 2020-05-23 HISTORY — DX: Unspecified abnormal cytological findings in specimens from cervix uteri: R87.619

## 2020-05-23 SURGERY — BREAST LUMPECTOMY WITH RADIOACTIVE SEED LOCALIZATION
Anesthesia: General | Site: Breast | Laterality: Left

## 2020-05-23 MED ORDER — ACETAMINOPHEN 500 MG PO TABS
1000.0000 mg | ORAL_TABLET | Freq: Once | ORAL | Status: AC
  Administered 2020-05-23: 1000 mg via ORAL

## 2020-05-23 MED ORDER — CEFAZOLIN SODIUM-DEXTROSE 2-4 GM/100ML-% IV SOLN
INTRAVENOUS | Status: AC
  Filled 2020-05-23: qty 100

## 2020-05-23 MED ORDER — LACTATED RINGERS IV SOLN: INTRAVENOUS | Status: DC

## 2020-05-23 MED ORDER — GABAPENTIN 300 MG PO CAPS
300.0000 mg | ORAL_CAPSULE | ORAL | Status: AC
  Administered 2020-05-23: 300 mg via ORAL

## 2020-05-23 MED ORDER — PROPOFOL 10 MG/ML IV BOLUS
INTRAVENOUS | Status: DC | PRN
  Administered 2020-05-23: 160 mg via INTRAVENOUS

## 2020-05-23 MED ORDER — LIDOCAINE 2% (20 MG/ML) 5 ML SYRINGE
INTRAMUSCULAR | Status: AC
  Filled 2020-05-23: qty 5

## 2020-05-23 MED ORDER — BUPIVACAINE HCL (PF) 0.25 % IJ SOLN
INTRAMUSCULAR | Status: AC
  Filled 2020-05-23: qty 60

## 2020-05-23 MED ORDER — 0.9 % SODIUM CHLORIDE (POUR BTL) OPTIME
TOPICAL | Status: DC | PRN
  Administered 2020-05-23: 1000 mL

## 2020-05-23 MED ORDER — ACETAMINOPHEN 500 MG PO TABS
ORAL_TABLET | ORAL | Status: AC
  Filled 2020-05-23: qty 2

## 2020-05-23 MED ORDER — SCOPOLAMINE 1 MG/3DAYS TD PT72
MEDICATED_PATCH | TRANSDERMAL | Status: AC
  Filled 2020-05-23: qty 1

## 2020-05-23 MED ORDER — CELECOXIB 200 MG PO CAPS
ORAL_CAPSULE | ORAL | Status: AC
  Filled 2020-05-23: qty 1

## 2020-05-23 MED ORDER — KETOROLAC TROMETHAMINE 30 MG/ML IJ SOLN: 30.0000 mg | Freq: Once | INTRAMUSCULAR | Status: DC | PRN

## 2020-05-23 MED ORDER — DEXAMETHASONE SODIUM PHOSPHATE 10 MG/ML IJ SOLN
INTRAMUSCULAR | Status: DC | PRN
  Administered 2020-05-23: 10 mg via INTRAVENOUS

## 2020-05-23 MED ORDER — GABAPENTIN 300 MG PO CAPS
ORAL_CAPSULE | ORAL | Status: AC
  Filled 2020-05-23: qty 1

## 2020-05-23 MED ORDER — MEPERIDINE HCL 25 MG/ML IJ SOLN: 6.2500 mg | INTRAMUSCULAR | Status: DC | PRN

## 2020-05-23 MED ORDER — PROPOFOL 10 MG/ML IV BOLUS
INTRAVENOUS | Status: AC
  Filled 2020-05-23: qty 20

## 2020-05-23 MED ORDER — FENTANYL CITRATE (PF) 100 MCG/2ML IJ SOLN
INTRAMUSCULAR | Status: AC
  Filled 2020-05-23: qty 2

## 2020-05-23 MED ORDER — CHLORHEXIDINE GLUCONATE CLOTH 2 % EX PADS: 6.0000 | MEDICATED_PAD | Freq: Once | CUTANEOUS | Status: DC

## 2020-05-23 MED ORDER — MIDAZOLAM HCL 5 MG/5ML IJ SOLN
INTRAMUSCULAR | Status: DC | PRN
  Administered 2020-05-23: 2 mg via INTRAVENOUS

## 2020-05-23 MED ORDER — BUPIVACAINE HCL 0.25 % IJ SOLN
INTRAMUSCULAR | Status: DC | PRN
  Administered 2020-05-23: 20 mL

## 2020-05-23 MED ORDER — ONDANSETRON HCL 4 MG/2ML IJ SOLN
INTRAMUSCULAR | Status: AC
  Filled 2020-05-23: qty 2

## 2020-05-23 MED ORDER — OXYCODONE HCL 5 MG PO TABS: 5.0000 mg | ORAL_TABLET | Freq: Once | ORAL | Status: DC | PRN

## 2020-05-23 MED ORDER — LIDOCAINE 2% (20 MG/ML) 5 ML SYRINGE
INTRAMUSCULAR | Status: DC | PRN
  Administered 2020-05-23: 100 mg via INTRAVENOUS

## 2020-05-23 MED ORDER — ONDANSETRON HCL 4 MG/2ML IJ SOLN
INTRAMUSCULAR | Status: DC | PRN
  Administered 2020-05-23: 4 mg via INTRAVENOUS

## 2020-05-23 MED ORDER — OXYCODONE HCL 5 MG/5ML PO SOLN: 5.0000 mg | Freq: Once | ORAL | Status: DC | PRN

## 2020-05-23 MED ORDER — CEFAZOLIN SODIUM-DEXTROSE 2-4 GM/100ML-% IV SOLN
2.0000 g | INTRAVENOUS | Status: AC
  Administered 2020-05-23: 2 g via INTRAVENOUS

## 2020-05-23 MED ORDER — SUCCINYLCHOLINE CHLORIDE 200 MG/10ML IV SOSY
PREFILLED_SYRINGE | INTRAVENOUS | Status: AC
  Filled 2020-05-23: qty 10

## 2020-05-23 MED ORDER — HYDROMORPHONE HCL 1 MG/ML IJ SOLN
INTRAMUSCULAR | Status: AC
  Filled 2020-05-23: qty 0.5

## 2020-05-23 MED ORDER — MIDAZOLAM HCL 2 MG/2ML IJ SOLN
INTRAMUSCULAR | Status: AC
  Filled 2020-05-23: qty 2

## 2020-05-23 MED ORDER — HYDROMORPHONE HCL 1 MG/ML IJ SOLN
0.2500 mg | INTRAMUSCULAR | Status: DC | PRN
  Administered 2020-05-23: 0.5 mg via INTRAVENOUS

## 2020-05-23 MED ORDER — PROMETHAZINE HCL 25 MG/ML IJ SOLN: 6.2500 mg | INTRAMUSCULAR | Status: DC | PRN

## 2020-05-23 MED ORDER — FENTANYL CITRATE (PF) 100 MCG/2ML IJ SOLN
INTRAMUSCULAR | Status: DC | PRN
  Administered 2020-05-23 (×4): 25 ug via INTRAVENOUS
  Administered 2020-05-23: 50 ug via INTRAVENOUS
  Administered 2020-05-23: 25 ug via INTRAVENOUS

## 2020-05-23 MED ORDER — HYDROCODONE-ACETAMINOPHEN 5-325 MG PO TABS: 1.0000 | ORAL_TABLET | Freq: Four times a day (QID) | ORAL | 0 refills | Status: DC | PRN

## 2020-05-23 MED ORDER — SCOPOLAMINE 1 MG/3DAYS TD PT72
1.0000 | MEDICATED_PATCH | TRANSDERMAL | Status: DC
  Administered 2020-05-23: 1.5 mg via TRANSDERMAL

## 2020-05-23 MED ORDER — ACETAMINOPHEN 500 MG PO TABS: 1000.0000 mg | ORAL_TABLET | ORAL | Status: DC

## 2020-05-23 MED ORDER — DEXAMETHASONE SODIUM PHOSPHATE 10 MG/ML IJ SOLN
INTRAMUSCULAR | Status: AC
  Filled 2020-05-23: qty 1

## 2020-05-23 MED ORDER — CELECOXIB 200 MG PO CAPS
200.0000 mg | ORAL_CAPSULE | ORAL | Status: AC
  Administered 2020-05-23: 200 mg via ORAL

## 2020-05-23 MED FILL — HYDROCODON-APAP 5-325: 5-325 | 2 days supply | Qty: 10 | Fill #0

## 2020-05-23 SURGICAL SUPPLY — 42 items
ADH SKN CLS APL DERMABOND .7 (GAUZE/BANDAGES/DRESSINGS) ×1
APL PRP STRL LF DISP 70% ISPRP (MISCELLANEOUS) ×1
APPLIER CLIP 9.375 MED OPEN (MISCELLANEOUS)
APR CLP MED 9.3 20 MLT OPN (MISCELLANEOUS)
BLADE SURG 15 STRL LF DISP TIS (BLADE) ×1 IMPLANT
BLADE SURG 15 STRL SS (BLADE) ×2
CANISTER SUC SOCK COL 7IN (MISCELLANEOUS) ×2 IMPLANT
CANISTER SUCT 1200ML W/VALVE (MISCELLANEOUS) ×2 IMPLANT
CHLORAPREP W/TINT 26 (MISCELLANEOUS) ×2 IMPLANT
CLIP APPLIE 9.375 MED OPEN (MISCELLANEOUS) IMPLANT
COVER BACK TABLE 60X90IN (DRAPES) ×2 IMPLANT
COVER MAYO STAND STRL (DRAPES) ×2 IMPLANT
COVER PROBE W GEL 5X96 (DRAPES) ×2 IMPLANT
COVER WAND RF STERILE (DRAPES) IMPLANT
DECANTER SPIKE VIAL GLASS SM (MISCELLANEOUS) IMPLANT
DERMABOND ADVANCED (GAUZE/BANDAGES/DRESSINGS) ×1
DERMABOND ADVANCED .7 DNX12 (GAUZE/BANDAGES/DRESSINGS) ×1 IMPLANT
DRAPE LAPAROSCOPIC ABDOMINAL (DRAPES) ×2 IMPLANT
DRAPE UTILITY XL STRL (DRAPES) ×2 IMPLANT
ELECT COATED BLADE 2.86 ST (ELECTRODE) ×2 IMPLANT
ELECT REM PT RETURN 9FT ADLT (ELECTROSURGICAL) ×2
ELECTRODE REM PT RTRN 9FT ADLT (ELECTROSURGICAL) ×1 IMPLANT
GLOVE SURG ENC MOIS LTX SZ7.5 (GLOVE) ×4 IMPLANT
GOWN STRL REUS W/ TWL LRG LVL3 (GOWN DISPOSABLE) ×2 IMPLANT
GOWN STRL REUS W/TWL LRG LVL3 (GOWN DISPOSABLE) ×4
ILLUMINATOR WAVEGUIDE N/F (MISCELLANEOUS) IMPLANT
KIT MARKER MARGIN INK (KITS) ×2 IMPLANT
LIGHT WAVEGUIDE WIDE FLAT (MISCELLANEOUS) IMPLANT
NEEDLE HYPO 25X1 1.5 SAFETY (NEEDLE) IMPLANT
NS IRRIG 1000ML POUR BTL (IV SOLUTION) IMPLANT
PACK BASIN DAY SURGERY FS (CUSTOM PROCEDURE TRAY) ×2 IMPLANT
PENCIL SMOKE EVACUATOR (MISCELLANEOUS) ×2 IMPLANT
SLEEVE SCD COMPRESS KNEE MED (MISCELLANEOUS) ×2 IMPLANT
SPONGE LAP 18X18 RF (DISPOSABLE) ×2 IMPLANT
SUT MON AB 4-0 PC3 18 (SUTURE) ×2 IMPLANT
SUT SILK 2 0 SH (SUTURE) IMPLANT
SUT VICRYL 3-0 CR8 SH (SUTURE) ×2 IMPLANT
SYR CONTROL 10ML LL (SYRINGE) IMPLANT
TOWEL GREEN STERILE FF (TOWEL DISPOSABLE) ×2 IMPLANT
TRAY FAXITRON CT DISP (TRAY / TRAY PROCEDURE) IMPLANT
TUBE CONNECTING 20X1/4 (TUBING) ×2 IMPLANT
YANKAUER SUCT BULB TIP NO VENT (SUCTIONS) IMPLANT

## 2020-05-23 NOTE — Discharge Instructions (Signed)

## 2020-05-23 NOTE — Op Note (Signed)
05/23/2020  1:58 PM  PATIENT:  Erika Galloway  45 y.o. female  PRE-OPERATIVE DIAGNOSIS:  LEFT BREAST LCIS  POST-OPERATIVE DIAGNOSIS:  LEFT BREAST LCIS  PROCEDURE:  Procedure(s): RADIOACTIVE SEED GUIDED LEFT BREAST LUMPECTOMY (Left)  SURGEON:  Surgeon(s) and Role:    * Jovita Kussmaul, MD - Primary  PHYSICIAN ASSISTANT:   ASSISTANTS: none   ANESTHESIA:   local and general  EBL:  1 mL   BLOOD ADMINISTERED:none  DRAINS: none   LOCAL MEDICATIONS USED:  MARCAINE     SPECIMEN:  Source of Specimen:  left breast tissue  DISPOSITION OF SPECIMEN:  PATHOLOGY  COUNTS:  YES  TOURNIQUET:  * No tourniquets in log *  DICTATION: .Dragon Dictation   After informed consent was obtained the patient was brought to the operating room and placed in the supine position on the operating table.  After adequate induction of general anesthesia the patient's left breast was prepped with ChloraPrep, allowed to dry, and draped in usual sterile manner.  An appropriate timeout was performed.  Previously an I-125 seed was placed in the upper outer central left breast to mark an area of lobular carcinoma in situ.  The neoprobe was set to I-125 in the area of radioactivity was readily identified.  The area around this was infiltrated with quarter percent Marcaine.  A curvilinear incision was made along the upper outer edge of the areola of the left breast with a 15 blade knife.  The incision was carried through the skin and subcutaneous tissue sharply with the electrocautery.  Dissection was then carried towards the radioactive seed under the direction of the neoprobe.  Once I more closely approached the radioactive seed I then removed a circular portion of breast tissue sharply with the electrocautery around the radioactive seed while checking the area of radioactivity frequently.  Once the specimen was removed it was oriented with the appropriate paint colors.  A specimen radiograph was obtained that showed the  clip and seed to be within the specimen.  The specimen was then sent to pathology for further evaluation.  Hemostasis was achieved using the Bovie electrocautery.  The wound was irrigated with saline and infiltrated with quarter percent Marcaine.  The deep layer of the wound was then closed with layers of interrupted 3-0 Vicryl stitches.  The skin was closed with interrupted 4-0 Monocryl subcuticular stitches.  Dermabond dressings were applied.  The patient tolerated the procedure well.  At the end of the case all needle sponge and instrument counts were correct.  The patient was then awakened and taken to recovery in stable condition.  PLAN OF CARE: Discharge to home after PACU  PATIENT DISPOSITION:  PACU - hemodynamically stable.   Delay start of Pharmacological VTE agent (>24hrs) due to surgical blood loss or risk of bleeding: not applicable

## 2020-05-23 NOTE — Interval H&P Note (Signed)
History and Physical Interval Note:  05/23/2020 12:39 PM  Erika Galloway  has presented today for surgery, with the diagnosis of LEFT BREAST LCIS.  The various methods of treatment have been discussed with the patient and family. After consideration of risks, benefits and other options for treatment, the patient has consented to  Procedure(s): LEFT BREAST LUMPECTOMY WITH RADIOACTIVE SEED LOCALIZATION (Left) as a surgical intervention.  The patient's history has been reviewed, patient examined, no change in status, stable for surgery.  I have reviewed the patient's chart and labs.  Questions were answered to the patient's satisfaction.     Autumn Messing III

## 2020-05-23 NOTE — Transfer of Care (Signed)
Immediate Anesthesia Transfer of Care Note  Patient: Erika Galloway  Procedure(s) Performed: RADIOACTIVE SEED GUIDED LEFT BREAST LUMPECTOMY (Left Breast)  Patient Location: PACU  Anesthesia Type:General  Level of Consciousness: drowsy  Airway & Oxygen Therapy: Patient Spontanous Breathing and Patient connected to face mask oxygen  Post-op Assessment: Report given to RN and Post -op Vital signs reviewed and stable  Post vital signs: Reviewed and stable  Last Vitals:  Vitals Value Taken Time  BP 133/86 05/23/20 1402  Temp    Pulse 87 05/23/20 1405  Resp 13 05/23/20 1405  SpO2 100 % 05/23/20 1405  Vitals shown include unvalidated device data.  Last Pain:  Vitals:   05/23/20 1232  TempSrc: Oral  PainSc: 0-No pain         Complications: No complications documented.

## 2020-05-23 NOTE — H&P (Signed)
Erika Galloway  Location: [facility] Patient #: [id number] DOB: 25-Jun-1975 Married / Language: English / Race: Black or African American Female   History of Present Illness  The patient is a 45 year old female who presents with a breast mass. We are asked to see the patient in consultation by Dr. Aram Candela to evaluate her for lobular carcinoma in situ of the left breast. The patient is a 45 year old black female who recently went for a routine screening mammogram. At that time she was found to have an 8 mm area of distortion in the upper outer quadrant of the left breast centrally. This was seen on last year's mammogram and measured 7 mm at that time. The axilla negative. The distortion was biopsied and came back as lobular carcinoma in situ. She does not have any family history of breast cancer. She is otherwise in good health and does not smoke. She does report some occasional fast heartbeat.   Past Surgical History Oral Surgery   Diagnostic Studies History  Colonoscopy  never Mammogram  within last year Pap Smear  1-5 years ago  Allergies  No Known Drug Allergies    Medication History  Adderall (30MG  Tablet, Oral) Active. Medications Reconciled  Social History  Alcohol use  Recently quit alcohol use. Caffeine use  Coffee. No drug use  Tobacco use  Former smoker.  Family History  Arthritis  Mother. Ischemic Bowel Disease  Family Members In General. Migraine Headache  Family Members In General. Thyroid problems  Family Members In General.  Pregnancy / Birth History Age at menarche  35 years. Contraceptive History  Intrauterine device. Gravida  2 Maternal age  81-30 Para  2 Regular periods   Other Problems No pertinent past medical history     Review of Systems  General Not Present- Appetite Loss, Chills, Fatigue, Fever, Night Sweats, Weight Gain and Weight Loss. Skin Not Present- Change in Wart/Mole, Dryness, Hives,  Jaundice, New Lesions, Non-Healing Wounds, Rash and Ulcer. HEENT Present- Wears glasses/contact lenses. Not Present- Earache, Hearing Loss, Hoarseness, Nose Bleed, Oral Ulcers, Ringing in the Ears, Seasonal Allergies, Sinus Pain, Sore Throat, Visual Disturbances and Yellow Eyes. Respiratory Not Present- Bloody sputum, Chronic Cough, Difficulty Breathing, Snoring and Wheezing. Breast Present- Breast Pain. Not Present- Breast Mass, Nipple Discharge and Skin Changes. Cardiovascular Not Present- Chest Pain, Difficulty Breathing Lying Down, Leg Cramps, Palpitations, Rapid Heart Rate, Shortness of Breath and Swelling of Extremities. Gastrointestinal Not Present- Abdominal Pain, Bloating, Bloody Stool, Change in Bowel Habits, Chronic diarrhea, Constipation, Difficulty Swallowing, Excessive gas, Gets full quickly at meals, Hemorrhoids, Indigestion, Nausea, Rectal Pain and Vomiting. Female Genitourinary Not Present- Frequency, Nocturia, Painful Urination, Pelvic Pain and Urgency. Musculoskeletal Present- Back Pain. Not Present- Joint Pain, Joint Stiffness, Muscle Pain, Muscle Weakness and Swelling of Extremities. Neurological Not Present- Decreased Memory, Fainting, Headaches, Numbness, Seizures, Tingling, Tremor, Trouble walking and Weakness. Psychiatric Not Present- Anxiety, Bipolar, Change in Sleep Pattern, Depression, Fearful and Frequent crying. Endocrine Not Present- Cold Intolerance, Excessive Hunger, Hair Changes, Heat Intolerance, Hot flashes and New Diabetes. Hematology Not Present- Blood Thinners, Easy Bruising, Excessive bleeding, Gland problems, HIV and Persistent Infections.  Vitals Weight: 174.38 lb Height: 66in Body Surface Area: 1.89 m Body Mass Index: 28.14 kg/m  Pulse: 99 (Regular)  BP: 134/82(Sitting, Left Arm, Standard)       Physical Exam  General Mental Status-Alert. General Appearance-Consistent with stated age. Hydration-Well  hydrated. Voice-Normal.  Head and Neck Head-normocephalic, atraumatic with no lesions or palpable masses. Trachea-midline.  Thyroid Gland Characteristics - normal size and consistency.  Eye Eyeball - Bilateral-Extraocular movements intact. Sclera/Conjunctiva - Bilateral-No scleral icterus.  Chest and Lung Exam Chest and lung exam reveals -quiet, even and easy respiratory effort with no use of accessory muscles and on auscultation, normal breath sounds, no adventitious sounds and normal vocal resonance. Inspection Chest Wall - Normal. Back - normal.  Breast Note: There is no palpable mass in either breast. There is no palpable axillary, supraclavicular, or cervical lymphadenopathy.   Cardiovascular Cardiovascular examination reveals -normal heart sounds, regular rate and rhythm with no murmurs and normal pedal pulses bilaterally.  Abdomen Inspection Inspection of the abdomen reveals - No Hernias. Skin - Scar - no surgical scars. Palpation/Percussion Palpation and Percussion of the abdomen reveal - Soft, Non Tender, No Rebound tenderness, No Rigidity (guarding) and No hepatosplenomegaly. Auscultation Auscultation of the abdomen reveals - Bowel sounds normal.  Neurologic Neurologic evaluation reveals -alert and oriented x 3 with no impairment of recent or remote memory. Mental Status-Normal.  Musculoskeletal Normal Exam - Left-Upper Extremity Strength Normal and Lower Extremity Strength Normal. Normal Exam - Right-Upper Extremity Strength Normal and Lower Extremity Strength Normal.  Lymphatic Head & Neck  General Head & Neck Lymphatics: Bilateral - Description - Normal. Axillary  General Axillary Region: Bilateral - Description - Normal. Tenderness - Non Tender. Femoral & Inguinal  Generalized Femoral & Inguinal Lymphatics: Bilateral - Description - Normal. Tenderness - Non Tender.    Assessment & Plan  LOBULAR CARCINOMA IN SITU (LCIS) OF LEFT  BREAST (D05.02) Impression: The patient appears to have an 8 mm area of lobular carcinoma in situ in the upper outer quadrant of the left breast. I discussed with her in detail today how this is considered a risk factor for breast cancer but not a precursor. Using the risk calculating her her lifetime risk of breast cancer is estimated at 60%. Because of the appearance of the distortion that would recommend removing this area to make sure that we are not missing something more significant. I have discussed with her in detail the risks and benefits of the operation to do this as well as some of the technical aspects including the use of a radioactive seed for localization and she understands and wishes to proceed. We will get an MRI study to make sure that no other significant areas are present in either breast. We will also refer her to the high-risk clinic at the cancer center to talk about risk reduction. I will also refer her to cardiology to evaluate some intermittent tachycardia that she has been experiencing. This patient encounter took 60 minutes today to perform the following: take history, perform exam, review outside records, interpret imaging, counsel the patient on their diagnosis and document encounter, findings & plan in the EHR Current Plans Referred to Oncology, for evaluation and follow up (Oncology). Routine. MRI, breast, w/o and w contrast material(s), including computer-aided detection (CAD real-time lesion detection, characterization and pharmacokinetic analysis), when performed; unilateral(77048)(ACR 5 )(DSN [id number])(G-Code G1004(ME)) (Clinical Indications: Lobular carcinoma in situ (LCIS); please schedule at [facility]) Referred to Cardiology, for evaluation and follow up (Cardiology). Routine.

## 2020-05-23 NOTE — Anesthesia Procedure Notes (Signed)
Procedure Name: LMA Insertion Date/Time: 05/23/2020 1:12 PM Performed by: Ezequiel Kayser, CRNA Pre-anesthesia Checklist: Patient identified, Emergency Drugs available, Suction available and Patient being monitored Patient Re-evaluated:Patient Re-evaluated prior to induction Oxygen Delivery Method: Circle System Utilized Preoxygenation: Pre-oxygenation with 100% oxygen Induction Type: IV induction Ventilation: Mask ventilation without difficulty LMA: LMA inserted LMA Size: 4.0 Number of attempts: 1 Airway Equipment and Method: Bite block Placement Confirmation: positive ETCO2 Tube secured with: Tape Dental Injury: Teeth and Oropharynx as per pre-operative assessment  Comments: Eyes taped prior to insertion

## 2020-05-23 NOTE — Anesthesia Preprocedure Evaluation (Addendum)
Anesthesia Evaluation  Patient identified by MRN, date of birth, ID band Patient awake    Reviewed: Allergy & Precautions, NPO status , Patient's Chart, lab work & pertinent test results  History of Anesthesia Complications Negative for: history of anesthetic complications  Airway Mallampati: II  TM Distance: >3 FB Neck ROM: Full    Dental  (+) Missing, Poor Dentition, Loose,    Pulmonary  COVID 04/19/20   Pulmonary exam normal        Cardiovascular negative cardio ROS Normal cardiovascular exam     Neuro/Psych PSYCHIATRIC DISORDERS Depression    GI/Hepatic negative GI ROS, Neg liver ROS,   Endo/Other  negative endocrine ROS  Renal/GU negative Renal ROS  negative genitourinary   Musculoskeletal negative musculoskeletal ROS (+)   Abdominal   Peds  Hematology negative hematology ROS (+)   Anesthesia Other Findings L breast LCIS   Reproductive/Obstetrics negative OB ROS                            Anesthesia Physical Anesthesia Plan  ASA: II  Anesthesia Plan: General   Post-op Pain Management:    Induction: Intravenous  PONV Risk Score and Plan: 3 and Ondansetron, Dexamethasone, Midazolam and Treatment may vary due to age or medical condition  Airway Management Planned: LMA  Additional Equipment: None  Intra-op Plan:   Post-operative Plan: Extubation in OR  Informed Consent: I have reviewed the patients History and Physical, chart, labs and discussed the procedure including the risks, benefits and alternatives for the proposed anesthesia with the patient or authorized representative who has indicated his/her understanding and acceptance.     Dental advisory given  Plan Discussed with: CRNA  Anesthesia Plan Comments: (COVID 64mo ago, poss reactive airway )       Anesthesia Quick Evaluation

## 2020-05-24 ENCOUNTER — Encounter (HOSPITAL_BASED_OUTPATIENT_CLINIC_OR_DEPARTMENT_OTHER): Payer: Self-pay | Admitting: General Surgery

## 2020-05-24 NOTE — Anesthesia Postprocedure Evaluation (Signed)
Anesthesia Post Note  Patient: Maurene Capes  Procedure(s) Performed: RADIOACTIVE SEED GUIDED LEFT BREAST LUMPECTOMY (Left Breast)     Patient location during evaluation: PACU Anesthesia Type: General Level of consciousness: awake and alert and oriented Pain management: pain level controlled Vital Signs Assessment: post-procedure vital signs reviewed and stable Respiratory status: spontaneous breathing, nonlabored ventilation and respiratory function stable Cardiovascular status: blood pressure returned to baseline Postop Assessment: no apparent nausea or vomiting Anesthetic complications: no   No complications documented.  Last Vitals:  Vitals:   05/23/20 1445 05/23/20 1500  BP: 127/82 126/81  Pulse: 82 86  Resp: 14 16  Temp:  36.7 C  SpO2: 99% 100%    Last Pain:  Vitals:   05/23/20 1232  TempSrc: Oral                 Brennan Bailey

## 2020-06-06 LAB — SURGICAL PATHOLOGY

## 2020-06-12 NOTE — Progress Notes (Deleted)
Cardiology Office Note:   Date:  06/12/2020  NAME:  Erika Galloway    MRN: [id number] DOB:  01/30/1976   PCP:  Caryl Bis, MD  Cardiologist:  No primary care provider on file.  Electrophysiologist:  None   Referring MD: Jovita Kussmaul, MD   No chief complaint on file. ***  History of Present Illness:   Erika Galloway is a 45 y.o. female with a hx of LCIS s/p lumpectomy who is being seen today for the evaluation of tachycardia/palpitations at the request of Autumn Messing III, MD. Recently diagnosed with LICS of the breast and is s/p lumpectomy.   Problem List 1. Breast CA -LCIS s/p lumpectomy 05/23/2020  Past Medical History: Past Medical History:  Diagnosis Date  . Abnormal Pap smear of cervix   . Depression   . History of COVID-19 04/19/2020    Past Surgical History: Past Surgical History:  Procedure Laterality Date  . ADENOIDECTOMY    . BREAST LUMPECTOMY WITH RADIOACTIVE SEED LOCALIZATION Left 05/23/2020   Procedure: RADIOACTIVE SEED GUIDED LEFT BREAST LUMPECTOMY;  Surgeon: Jovita Kussmaul, MD;  Location: [facility];  Service: General;  Laterality: Left;    Current Medications: No outpatient medications have been marked as taking for the 06/13/20 encounter (Appointment) with Geralynn Rile, MD.     Allergies:    Patient has no known allergies.   Social History: Social History   Socioeconomic History  . Marital status: Married    Spouse name: Not on file  . Number of children: Not on file  . Years of education: Not on file  . Highest education level: Not on file  Occupational History  . Not on file  Tobacco Use  . Smoking status: Never Smoker  . Smokeless tobacco: Never Used  Vaping Use  . Vaping Use: Never used  Substance and Sexual Activity  . Alcohol use: Never  . Drug use: Never  . Sexual activity: Not on file  Other Topics Concern  . Not on file  Social History Narrative  . Not on file   Social Determinants of Health    Financial Resource Strain: Low Risk   . Difficulty of Paying Living Expenses: Not very hard  Food Insecurity: No Food Insecurity  . Worried About Charity fundraiser in the Last Year: Never true  . Ran Out of Food in the Last Year: Never true  Transportation Needs: No Transportation Needs  . Lack of Transportation (Medical): No  . Lack of Transportation (Non-Medical): No  Physical Activity: Inactive  . Days of Exercise per Week: 0 days  . Minutes of Exercise per Session: 0 min  Stress: No Stress Concern Present  . Feeling of Stress : Only a little  Social Connections: Moderately Integrated  . Frequency of Communication with Friends and Family: More than three times a week  . Frequency of Social Gatherings with Friends and Family: Three times a week  . Attends Religious Services: 1 to 4 times per year  . Active Member of Clubs or Organizations: No  . Attends Archivist Meetings: Never  . Marital Status: Married     Family History: The patient's ***family history is not on file.  ROS:   All other ROS reviewed and negative. Pertinent positives noted in the HPI.     EKGs/Labs/Other Studies Reviewed:   The following studies were personally reviewed by me today:  EKG:  EKG is *** ordered today.  The ekg ordered  today demonstrates ***, and was personally reviewed by me.   Recent Labs: No results found for requested labs within last 8760 hours.   Recent Lipid Panel    Component Value Date/Time   CHOL 249 (H) 02/06/2019 0916   TRIG 177 (H) 02/06/2019 0916   HDL 90 02/06/2019 0916   CHOLHDL 2.8 02/06/2019 0916   VLDL 35 02/06/2019 0916   LDLCALC 124 (H) 02/06/2019 0916    Physical Exam:   VS:  There were no vitals taken for this visit.   Wt Readings from Last 3 Encounters:  05/23/20 179 lb 3.7 oz (81.3 kg)  05/17/20 174 lb 6.4 oz (79.1 kg)    General: Well nourished, well developed, in no acute distress Head: Atraumatic, normal size  Eyes: PEERLA, EOMI   Neck: Supple, no JVD Endocrine: No thryomegaly Cardiac: Normal S1, S2; RRR; no murmurs, rubs, or gallops Lungs: Clear to auscultation bilaterally, no wheezing, rhonchi or rales  Abd: Soft, nontender, no hepatomegaly  Ext: No edema, pulses 2+ Musculoskeletal: No deformities, BUE and BLE strength normal and equal Skin: Warm and dry, no rashes   Neuro: Alert and oriented to person, place, time, and situation, CNII-XII grossly intact, no focal deficits  Psych: Normal mood and affect   ASSESSMENT:   Erika Galloway is a 45 y.o. female who presents for the following: No diagnosis found.  PLAN:   There are no diagnoses linked to this encounter.  Disposition: No follow-ups on file.  Medication Adjustments/Labs and Tests Ordered: Current medicines are reviewed at length with the patient today.  Concerns regarding medicines are outlined above.  No orders of the defined types were placed in this encounter.  No orders of the defined types were placed in this encounter.   There are no Patient Instructions on file for this visit.   Time Spent with Patient: I have spent a total of *** minutes with patient reviewing hospital notes, telemetry, EKGs, labs and examining the patient as well as establishing an assessment and plan that was discussed with the patient.  > 50% of time was spent in direct patient care.  Signed, Addison Naegeli. Audie Box, MD, [place]  [address], [place], [place] [address] [phone]  06/12/2020 5:26 PM

## 2020-06-13 ENCOUNTER — Ambulatory Visit: Payer: No Typology Code available for payment source | Admitting: Cardiovascular Disease

## 2020-06-13 DIAGNOSIS — R000 Tachycardia, unspecified: Secondary | ICD-10-CM

## 2020-06-13 DIAGNOSIS — R002 Palpitations: Secondary | ICD-10-CM

## 2020-06-14 ENCOUNTER — Other Ambulatory Visit: Payer: Self-pay

## 2020-06-14 ENCOUNTER — Inpatient Hospital Stay (HOSPITAL_COMMUNITY): Payer: No Typology Code available for payment source | Attending: Hematology | Admitting: Hematology

## 2020-06-14 ENCOUNTER — Other Ambulatory Visit (HOSPITAL_COMMUNITY): Payer: Self-pay | Admitting: Hematology

## 2020-06-14 ENCOUNTER — Ambulatory Visit (HOSPITAL_COMMUNITY): Payer: No Typology Code available for payment source | Admitting: Hematology

## 2020-06-14 VITALS — BP 138/83 | HR 102 | Temp 97.3°F | Resp 20 | Wt 179.3 lb

## 2020-06-14 DIAGNOSIS — Z79899 Other long term (current) drug therapy: Secondary | ICD-10-CM | POA: Diagnosis not present

## 2020-06-14 DIAGNOSIS — Z79810 Long term (current) use of selective estrogen receptor modulators (SERMs): Secondary | ICD-10-CM | POA: Diagnosis not present

## 2020-06-14 DIAGNOSIS — D0502 Lobular carcinoma in situ of left breast: Secondary | ICD-10-CM | POA: Insufficient documentation

## 2020-06-14 MED ORDER — TAMOXIFEN CITRATE 20 MG PO TABS: 20.0000 mg | ORAL_TABLET | Freq: Every day | ORAL | 6 refills | Status: DC

## 2020-06-14 MED FILL — TAMOXIFEN 20 MG TABLET: 20 | 30 days supply | Qty: 30 | Fill #0

## 2020-06-14 NOTE — Progress Notes (Addendum)
[facility] [address] [place] [address]   Patient Care Team: Caryl Bis, MD as PCP - General (Family Medicine)  SUMMARY OF ONCOLOGIC HISTORY: Oncology History   No history exists.    CHIEF COMPLIANT: Follow-up for left breast LCIS   INTERVAL HISTORY: [name] is a 45 y.o. female here today for follow up of her left breast LCIS. Her last visit was on 05/17/2020.   Today she reports feeling well. She tolerated her left breast lumpectomy on 02/14 with Dr. Marlou Starks well. She continues having menstruation without any issues.  She will have a LEEP next month, on 04/01.   REVIEW OF SYSTEMS:   Review of Systems  Constitutional: Negative for appetite change and fatigue.  Genitourinary: Negative for menstrual problem.   All other systems reviewed and are negative.   I have reviewed the past medical history, past surgical history, social history and family history with the patient and they are unchanged from previous note.   ALLERGIES:   has No Known Allergies.   MEDICATIONS:  Current Outpatient Medications  Medication Sig Dispense Refill  . amphetamine-dextroamphetamine (ADDERALL) 30 MG tablet     . tamoxifen (NOLVADEX) 20 MG tablet Take 1 tablet (20 mg total) by mouth daily. 30 tablet 6  . HYDROcodone-acetaminophen (NORCO/VICODIN) 5-325 MG tablet Take 1-2 tablets by mouth every 6 (six) hours as needed for moderate pain or severe pain. (Patient not taking: Reported on 06/14/2020) 10 tablet 0   No current facility-administered medications for this visit.     PHYSICAL EXAMINATION: Performance status (ECOG): 0 - Asymptomatic  Vitals:   06/14/20 1126  BP: 138/83  Pulse: (!) 102  Resp: 20  Temp: (!) 97.3 F (36.3 C)  SpO2: 100%   Wt Readings from Last 3 Encounters:  06/14/20 179 lb 4.8 oz (81.3 kg)  05/23/20 179 lb 3.7 oz (81.3 kg)  05/17/20 174 lb 6.4 oz (79.1 kg)   Physical Exam Vitals reviewed.  Constitutional:       Appearance: Normal appearance.  Neurological:     General: No focal deficit present.     Mental Status: She is alert and oriented to person, place, and time.  Psychiatric:        Mood and Affect: Mood normal.        Behavior: Behavior normal.     Breast Exam Chaperone: Milinda Antis, MD     LABORATORY DATA:  I have reviewed the data as listed CMP 06/28/2007 06/25/2007  Glucose 76 79  BUN 2(L) 2(L)  Creatinine 0.57 0.54  Sodium 134(L) 136  Potassium 3.6 4.2  Chloride 109 108  CO2 21 21  Calcium 8.3(L) 8.5  Total Protein 5.4(L) 5.4(L)  Total Bilirubin 0.7 0.8  Alkaline Phos 106 112  AST 15 13  ALT 10 9   No results found for: [id number] Lab Results  Component Value Date   WBC 11.1 (H) 06/29/2007   HGB 10.1 DELTA CHECK NOTED (L) 06/29/2007   HCT 28.9 (L) 06/29/2007   MCV 94.6 06/29/2007   PLT 195 06/29/2007   Surgical pathology ([id number]) on 05/23/2020: Left breast lumpectomy: lobular carcinoma in situ, 0.6 cm; ER/PR positive, HER-2 negative.  ASSESSMENT:  1.  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 -  Left breast biopsy, 1 o'clock position on 04/07/2020-LCIS involving a fibroadenoma. -MRI of the breast on 05/11/2020 with 6 mm enhancing mass in the posterior upper outer left breast.  No evidence of malignant cells within the breast.  No axillary lymph nodes. -Left lumpectomy on 05/23/2020 with 0.6 cm LCIS involving a fibroadenoma, LCIS focally less than 0.1 cm from lateral margin.  ER 70% positive, PR 90% positive, HER-2 negative.  No invasive component present.  2.  Social/family history: -She works as a Tourist information centre manager at [employer]. 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  No malignancies on her mother side.   PLAN:   1.  LCIS of the upper outer quadrant of left breast: -We discussed the pathology report of the left lumpectomy in detail.  No invasive component was seen.  She has about 7-10 fold increase in the risk of developing invasive cancer based on her diagnosis of LCIS although not every woman with LCIS will develop invasive cancer. -Recommend yearly mammograms for surveillance. -Recommend chemoprophylaxis with tamoxifen for 5 years. -Discussed side effects of tamoxifen in detail. -Recommend Pap smear yearly while she is on tamoxifen. -RTC 3 months for follow-up to see how she is tolerating tamoxifen.  After that she will be seen every 6 months.    No orders of the defined types were placed in this encounter.  The patient has a good understanding of the overall plan. she agrees with it. she will call with any problems that may develop before the next visit here.    Derek Jack, MD [place] [phone]   I, Milinda Antis, am acting as a scribe for Dr. Sanda Linger.  I, Derek Jack MD, have reviewed the above documentation for accuracy and completeness, and I agree with the above.

## 2020-06-14 NOTE — Patient Instructions (Signed)
[place] at [facility] Discharge Instructions  You were seen today by Dr. Delton Coombes. He went over your recent results. The mass that was removed from your breast was lobular carcinoma in situ, which is a precancerous lesion; it also increases your risk of having breast cancer in the future. Given that, the best course of treatment is taking tamoxifen 20 mg daily for 5 years. Side effects include hot flashes, greater menstrual bleeding, and mood changes. While on tamoxifen, you will need to have annual Pap smears. You may proceed with your LEEP procedure. You will be scheduled to have your next mammogram after March 29, 2021. Dr. Delton Coombes will see you back in 3 months for labs and follow up.   Thank you for choosing [place] at [facility] to provide your oncology and hematology care.  To afford each patient quality time with our provider, please arrive at least 15 minutes before your scheduled appointment time.   If you have a lab appointment with the [facility] please come in thru the Main Entrance and check in at the main information desk  You need to re-schedule your appointment should you arrive 10 or more minutes late.  We strive to give you quality time with our providers, and arriving late affects you and other patients whose appointments are after yours.  Also, if you no show three or more times for appointments you may be dismissed from the clinic at the providers discretion.     Again, thank you for choosing [facility].  Our hope is that these requests will decrease the amount of time that you wait before being seen by our physicians.       _____________________________________________________________  Should you have questions after your visit to [facility], please contact our office at [phone] between the hours of 8:00 a.m. and 4:30 p.m.  Voicemails left after 4:00 p.m. will not be returned until  the following business day.  For prescription refill requests, have your pharmacy contact our office and allow 72 hours.    Cancer Center Support Programs:   > Cancer Support Group  2nd Tuesday of the month 1pm-2pm, Journey Room

## 2020-07-05 ENCOUNTER — Other Ambulatory Visit (HOSPITAL_COMMUNITY): Payer: Self-pay | Admitting: Obstetrics & Gynecology

## 2020-07-05 MED FILL — IBUPROFEN 800 MG TABS: 800 | 10 days supply | Qty: 30 | Fill #0

## 2020-07-05 MED FILL — diazePAM 2 MG TABS: 2 | 3 days supply | Qty: 3 | Fill #0

## 2020-07-05 MED FILL — HYDROCODON-APAP 5-325: 5-325 | 5 days supply | Qty: 25 | Fill #0

## 2020-07-05 MED FILL — PROMETHAZINE 25 MG TABLET: 25 | 3 days supply | Qty: 3 | Fill #0

## 2020-07-08 ENCOUNTER — Other Ambulatory Visit: Payer: Self-pay | Admitting: Obstetrics & Gynecology

## 2020-07-09 ENCOUNTER — Other Ambulatory Visit (HOSPITAL_COMMUNITY): Payer: Self-pay

## 2020-07-11 ENCOUNTER — Other Ambulatory Visit (HOSPITAL_COMMUNITY): Payer: Self-pay

## 2020-07-11 MED FILL — Tamoxifen Citrate Tab 20 MG (Base Equivalent): ORAL | 30 days supply | Qty: 30 | Fill #0 | Status: AC

## 2020-07-19 ENCOUNTER — Other Ambulatory Visit (HOSPITAL_COMMUNITY): Payer: Self-pay

## 2020-07-25 ENCOUNTER — Other Ambulatory Visit (HOSPITAL_COMMUNITY): Payer: Self-pay

## 2020-07-25 MED ORDER — SERTRALINE HCL 25 MG PO TABS
25.0000 mg | ORAL_TABLET | Freq: Every day | ORAL | 0 refills | Status: DC
  Filled 2020-07-25: qty 90, 90d supply, fill #0

## 2020-07-25 MED ORDER — LOSARTAN POTASSIUM 50 MG PO TABS
50.0000 mg | ORAL_TABLET | Freq: Every day | ORAL | 0 refills | Status: DC
  Filled 2020-07-25: qty 90, 90d supply, fill #0

## 2020-07-25 MED ORDER — AMPHETAMINE-DEXTROAMPHETAMINE 20 MG PO TABS
20.0000 mg | ORAL_TABLET | Freq: Every day | ORAL | 0 refills | Status: DC
  Filled 2020-07-25: qty 90, 90d supply, fill #0

## 2020-07-25 MED ORDER — AMPHETAMINE-DEXTROAMPHETAMINE 30 MG PO TABS
30.0000 mg | ORAL_TABLET | Freq: Two times a day (BID) | ORAL | 0 refills | Status: DC
Start: 2020-07-23 — End: 2020-08-17
  Filled 2020-07-25: qty 180, 90d supply, fill #0

## 2020-07-26 ENCOUNTER — Telehealth (HOSPITAL_COMMUNITY): Payer: Self-pay | Admitting: Surgery

## 2020-07-26 ENCOUNTER — Other Ambulatory Visit (HOSPITAL_COMMUNITY): Payer: Self-pay

## 2020-07-26 ENCOUNTER — Telehealth (HOSPITAL_COMMUNITY): Payer: Self-pay | Admitting: *Deleted

## 2020-07-26 NOTE — Telephone Encounter (Signed)
Pt had left a message asking if Tamoxifen and Losartan could be taken together, since her primary care physician just prescribed the Losartan.  The pt also stated that she had gained 8 lbs in two weeks, and she was very concerned about this weight/fluid gain.  Per Dr. Delton Coombes, the pt can take Losartan and Tamoxifen together.  Also, Dr. Delton Coombes stated that if the pt is retaining fluid she needs to call her primary MD back.  Pt verbalized understanding of these instructions and was told to call back if she had any more concerns.

## 2020-07-28 ENCOUNTER — Other Ambulatory Visit (HOSPITAL_COMMUNITY): Payer: Self-pay

## 2020-07-28 MED ORDER — CITALOPRAM HYDROBROMIDE 20 MG PO TABS
20.0000 mg | ORAL_TABLET | Freq: Every day | ORAL | 2 refills | Status: DC
  Filled 2020-07-28 – 2020-08-04 (×2): qty 90, 90d supply, fill #0

## 2020-07-28 MED ORDER — DULOXETINE HCL 30 MG PO CPEP
30.0000 mg | ORAL_CAPSULE | Freq: Every day | ORAL | 0 refills | Status: DC
  Filled 2020-07-28: qty 90, 90d supply, fill #0

## 2020-07-29 ENCOUNTER — Other Ambulatory Visit (HOSPITAL_COMMUNITY): Payer: Self-pay

## 2020-08-01 ENCOUNTER — Other Ambulatory Visit (HOSPITAL_COMMUNITY): Payer: Self-pay

## 2020-08-02 ENCOUNTER — Encounter (HOSPITAL_COMMUNITY): Payer: Self-pay

## 2020-08-02 NOTE — Telephone Encounter (Signed)
Pls advise.  

## 2020-08-03 ENCOUNTER — Other Ambulatory Visit (HOSPITAL_COMMUNITY): Payer: Self-pay

## 2020-08-04 ENCOUNTER — Other Ambulatory Visit (HOSPITAL_COMMUNITY): Payer: Self-pay

## 2020-08-04 MED ORDER — VENLAFAXINE HCL ER 37.5 MG PO CP24
37.5000 mg | ORAL_CAPSULE | Freq: Every day | ORAL | 0 refills | Status: DC
  Filled 2020-08-04: qty 30, 30d supply, fill #0

## 2020-08-11 ENCOUNTER — Other Ambulatory Visit (HOSPITAL_COMMUNITY): Payer: Self-pay

## 2020-08-11 MED FILL — Tamoxifen Citrate Tab 20 MG (Base Equivalent): ORAL | 30 days supply | Qty: 30 | Fill #1 | Status: AC

## 2020-08-12 ENCOUNTER — Other Ambulatory Visit (HOSPITAL_COMMUNITY): Payer: Self-pay

## 2020-08-17 ENCOUNTER — Ambulatory Visit (INDEPENDENT_AMBULATORY_CARE_PROVIDER_SITE_OTHER): Payer: No Typology Code available for payment source

## 2020-08-17 ENCOUNTER — Ambulatory Visit (INDEPENDENT_AMBULATORY_CARE_PROVIDER_SITE_OTHER): Payer: No Typology Code available for payment source | Admitting: Cardiology

## 2020-08-17 ENCOUNTER — Other Ambulatory Visit: Payer: Self-pay | Admitting: Cardiology

## 2020-08-17 ENCOUNTER — Encounter: Payer: Self-pay | Admitting: Cardiology

## 2020-08-17 ENCOUNTER — Other Ambulatory Visit: Payer: Self-pay

## 2020-08-17 VITALS — BP 118/72 | HR 114 | Ht 66.0 in | Wt 178.0 lb

## 2020-08-17 DIAGNOSIS — R011 Cardiac murmur, unspecified: Secondary | ICD-10-CM | POA: Diagnosis not present

## 2020-08-17 DIAGNOSIS — Z79899 Other long term (current) drug therapy: Secondary | ICD-10-CM | POA: Diagnosis not present

## 2020-08-17 DIAGNOSIS — R002 Palpitations: Secondary | ICD-10-CM

## 2020-08-17 DIAGNOSIS — R000 Tachycardia, unspecified: Secondary | ICD-10-CM

## 2020-08-17 NOTE — Progress Notes (Signed)
Clinical Summary Erika Galloway is a 45 y.o.female  1. Tachycardia - can have palpitations at times - symptoms occur 3 times a week. Can occur at rest.  - symptoms last about 10 minutes  - 1 cup coffee daily, occasional sodas, no tea, no energy drinks, casual EtoH on weekend - on adderall several years without changes  2. History of breast LCIS - s/p lumpectomy     Past Medical History:  Diagnosis Date  . Abnormal Pap smear of cervix   . Depression   . History of COVID-19 04/19/2020     No Known Allergies   Current Outpatient Medications  Medication Sig Dispense Refill  . amphetamine-dextroamphetamine (ADDERALL) 20 MG tablet TAKE 1 TABLET BY MOUTH EVERY DAY 90 tablet 0  . amphetamine-dextroamphetamine (ADDERALL) 20 MG tablet Take 1 tablet (20 mg total) by mouth daily. 90 tablet 0  . amphetamine-dextroamphetamine (ADDERALL) 30 MG tablet     . amphetamine-dextroamphetamine (ADDERALL) 30 MG tablet TAKE 1 TABLET BY MOUTH 2 TIMES A DAY 180 tablet 0  . amphetamine-dextroamphetamine (ADDERALL) 30 MG tablet TAKE 1 TABLET BY MOUTH TWO TIMES DAILY 180 tablet 0  . amphetamine-dextroamphetamine (ADDERALL) 30 MG tablet Take 1 tablet by mouth 2 (two) times daily. 180 tablet 0  . diazepam (VALIUM) 2 MG tablet TAKE 1 TABLET BY MOUTH 30 MINUTES BEFORE PROCEDURE WHILE IN THE OFFICE. 3 tablet 0  . HYDROcodone-acetaminophen (NORCO/VICODIN) 5-325 MG tablet TAKE 1 TABLET BY MOUTH EVERY 4-6 HOURS AS NEEDED 25 tablet 0  . HYDROcodone-acetaminophen (NORCO/VICODIN) 5-325 MG tablet TAKE 1-2 TABLETS BY MOUTH EVERY 6 (SIX) HOURS AS NEEDED FOR MODERATE PAIN OR SEVERE PAIN. (Patient not taking: Reported on 06/14/2020) 10 tablet 0  . ibuprofen (ADVIL) 800 MG tablet TAKE 1 TABLET BY MOUTH EVERY 8 HOURS AFTER A MEAL 30 tablet 0  . losartan (COZAAR) 50 MG tablet Take 1 Tablet by mouth every day 90 tablet 0  . metroNIDAZOLE (FLAGYL) 500 MG tablet TAKE 1 TABLET BY MOUTH TWICE DAILY FOR 7 DAYS. 14 tablet 0  .  promethazine (PHENERGAN) 25 MG tablet TAKE 1 TABLET BY MOUTH 30 MINUTES BEFORE PROCEDURE 3 tablet 0  . tamoxifen (NOLVADEX) 20 MG tablet TAKE 1 TABLET (20 MG TOTAL) BY MOUTH DAILY. 30 tablet 6  . venlafaxine XR (EFFEXOR-XR) 37.5 MG 24 hr capsule Take 1 capsule by mouth once day 30 capsule 0   No current facility-administered medications for this visit.     Past Surgical History:  Procedure Laterality Date  . ADENOIDECTOMY    . BREAST LUMPECTOMY WITH RADIOACTIVE SEED LOCALIZATION Left 05/23/2020   Procedure: RADIOACTIVE SEED GUIDED LEFT BREAST LUMPECTOMY;  Surgeon: Jovita Kussmaul, MD;  Location: [facility];  Service: General;  Laterality: Left;     No Known Allergies    No family history on file.   Social History Erika Galloway reports that she has never smoked. She has never used smokeless tobacco. Erika Galloway reports no history of alcohol use.   Review of Systems CONSTITUTIONAL: No weight loss, fever, chills, weakness or fatigue.  HEENT: Eyes: No visual loss, blurred vision, double vision or yellow sclerae.No hearing loss, sneezing, congestion, runny nose or sore throat.  SKIN: No rash or itching.  CARDIOVASCULAR: per hpi RESPIRATORY: No shortness of breath, cough or sputum.  GASTROINTESTINAL: No anorexia, nausea, vomiting or diarrhea. No abdominal pain or blood.  GENITOURINARY: No burning on urination, no polyuria NEUROLOGICAL: No headache, dizziness, syncope, paralysis, ataxia, numbness or tingling in  the extremities. No change in bowel or bladder control.  MUSCULOSKELETAL: No muscle, back pain, joint pain or stiffness.  LYMPHATICS: No enlarged nodes. No history of splenectomy.  PSYCHIATRIC: No history of depression or anxiety.  ENDOCRINOLOGIC: No reports of sweating, cold or heat intolerance. No polyuria or polydipsia.  Marland Kitchen   Physical Examination Today's Vitals   08/17/20 0908  BP: 118/72  Pulse: (!) 114  SpO2: 99%  Weight: 178 lb (80.7 kg)  Height: 5\' 6"   (1.676 m)   Body mass index is 28.73 kg/m.  Gen: resting comfortably, no acute distress HEENT: no scleral icterus, pupils equal round and reactive, no palptable cervical adenopathy,  CV: regular, tachy, 2/6 systolic murmur rusb, no jvd Resp: Clear to auscultation bilaterally GI: abdomen is soft, non-tender, non-distended, normal bowel sounds, no hepatosplenomegaly MSK: extremities are warm, no edema.  Skin: warm, no rash Neuro:  no focal deficits Psych: appropriate affect      Assessment and Plan  1. Palpitations/Tachycardia - EKG today shows sinus tach - obtain 7day monitor - obtain labs to look for underlying cause of sinus tach including cbc, TSH  2. Heart murmur - obtain echo  F/u 2 months    Arnoldo Lenis, M.D.,

## 2020-08-17 NOTE — Patient Instructions (Signed)
Medication Instructions:  Your physician recommends that you continue on your current medications as directed. Please refer to the Current Medication list given to you today.  *If you need a refill on your cardiac medications before your next appointment, please call your pharmacy*   Lab Work: CBC CMET TSH A!C LIPIDS- FASTING MAGNESIUM  If you have labs (blood work) drawn today and your tests are completely normal, you will receive your results only by: Marland Kitchen MyChart Message (if you have MyChart) OR . A paper copy in the mail If you have any lab test that is abnormal or we need to change your treatment, we will call you to review the results.   Testing/Procedures: Your physician has requested that you have an echocardiogram. Echocardiography is a painless test that uses sound waves to create images of your heart. It provides your doctor with information about the size and shape of your heart and how well your heart's chambers and valves are working. This procedure takes approximately one hour. There are no restrictions for this procedure.     Follow-Up: At [facility], you and your health needs are our priority.  As part of our continuing mission to provide you with exceptional heart care, we have created designated Provider Care Teams.  These Care Teams include your primary Cardiologist (physician) and Advanced Practice Providers (APPs -  Physician Assistants and Nurse Practitioners) who all work together to provide you with the care you need, when you need it.  We recommend signing up for the patient portal called "MyChart".  Sign up information is provided on this After Visit Summary.  MyChart is used to connect with patients for Virtual Visits (Telemedicine).  Patients are able to view lab/test results, encounter notes, upcoming appointments, etc.  Non-urgent messages can be sent to your provider as well.   To learn more about what you can do with MyChart, go to  [email or web address].    Your next appointment:   2 month(s)  The format for your next appointment:   In Person  Provider:   Bernerd Pho, PA-C   Other Instructions  Echocardiogram An echocardiogram is a test that uses sound waves (ultrasound) to produce images of the heart. Images from an echocardiogram can provide important information about:  Heart size and shape.  The size and thickness and movement of your heart's walls.  Heart muscle function and strength.  Heart valve function or if you have stenosis. Stenosis is when the heart valves are too narrow.  If blood is flowing backward through the heart valves (regurgitation).  A tumor or infectious growth around the heart valves.  Areas of heart muscle that are not working well because of poor blood flow or injury from a heart attack.  Aneurysm detection. An aneurysm is a weak or damaged part of an artery wall. The wall bulges out from the normal force of blood pumping through the body. Tell a health care provider about:  Any allergies you have.  All medicines you are taking, including vitamins, herbs, eye drops, creams, and over-the-counter medicines.  Any blood disorders you have.  Any surgeries you have had.  Any medical conditions you have.  Whether you are pregnant or may be pregnant. What are the risks? Generally, this is a safe test. However, problems may occur, including an allergic reaction to dye (contrast) that may be used during the test. What happens before the test? No specific preparation is needed. You may eat and drink normally. What happens during the test?  You will take off your clothes from the waist up and put on a hospital gown.  Electrodes or electrocardiogram (ECG)patches may be placed on your chest. The electrodes or patches are then connected to a device that monitors your heart rate and rhythm.  You will lie down on a table for an ultrasound exam. A gel will be applied to  your chest to help sound waves pass through your skin.  A handheld device, called a transducer, will be pressed against your chest and moved over your heart. The transducer produces sound waves that travel to your heart and bounce back (or "echo" back) to the transducer. These sound waves will be captured in real-time and changed into images of your heart that can be viewed on a video monitor. The images will be recorded on a computer and reviewed by your health care provider.  You may be asked to change positions or hold your breath for a short time. This makes it easier to get different views or better views of your heart.  In some cases, you may receive contrast through an IV in one of your veins. This can improve the quality of the pictures from your heart. The procedure may vary among health care providers and hospitals.   What can I expect after the test? You may return to your normal, everyday life, including diet, activities, and medicines, unless your health care provider tells you not to do that. Follow these instructions at home:  It is up to you to get the results of your test. Ask your health care provider, or the department that is doing the test, when your results will be ready.  Keep all follow-up visits. This is important. Summary  An echocardiogram is a test that uses sound waves (ultrasound) to produce images of the heart.  Images from an echocardiogram can provide important information about the size and shape of your heart, heart muscle function, heart valve function, and other possible heart problems.  You do not need to do anything to prepare before this test. You may eat and drink normally.  After the echocardiogram is completed, you may return to your normal, everyday life, unless your health care provider tells you not to do that. This information is not intended to replace advice given to you by your health care provider. Make sure you discuss any questions you have  with your health care provider. Document Revised: 11/17/2019 Document Reviewed: 11/17/2019 Elsevier Patient Education  2021 [employer].

## 2020-08-22 DIAGNOSIS — R002 Palpitations: Secondary | ICD-10-CM | POA: Diagnosis not present

## 2020-08-26 ENCOUNTER — Ambulatory Visit (HOSPITAL_COMMUNITY): Admission: RE | Admit: 2020-08-26 | Payer: No Typology Code available for payment source | Source: Ambulatory Visit

## 2020-08-29 ENCOUNTER — Telehealth: Payer: Self-pay | Admitting: Cardiology

## 2020-08-29 DIAGNOSIS — R000 Tachycardia, unspecified: Secondary | ICD-10-CM

## 2020-08-29 NOTE — Telephone Encounter (Signed)
Ok to add bnp   J Colleen Donahoe MD

## 2020-08-29 NOTE — Telephone Encounter (Signed)
I will send request for BNP to Dr.Branch to review.

## 2020-08-29 NOTE — Telephone Encounter (Signed)
New message    Can you add a BNP to her blood work panel , she is having water retention and would like to have this checked   Pt c/o swelling: STAT is pt has developed SOB within 24 hours  1) How much weight have you gained and in what time span? 7lbs in a week   2) If swelling, where is the swelling located? She is not sure where   3) Are you currently taking a fluid pill? no  4) Are you currently SOB? A little   5) Do you have a log of your daily weights (if so, list)? no  6) Have you gained 3 pounds in a day or 5 pounds in a week? 7 lb  7) Have you traveled recently? no

## 2020-08-29 NOTE — Telephone Encounter (Signed)
Patent notified, order entered

## 2020-09-07 ENCOUNTER — Inpatient Hospital Stay (HOSPITAL_COMMUNITY): Payer: No Typology Code available for payment source | Attending: Hematology

## 2020-09-07 ENCOUNTER — Other Ambulatory Visit: Payer: Self-pay

## 2020-09-07 DIAGNOSIS — Z79810 Long term (current) use of selective estrogen receptor modulators (SERMs): Secondary | ICD-10-CM | POA: Insufficient documentation

## 2020-09-07 DIAGNOSIS — E559 Vitamin D deficiency, unspecified: Secondary | ICD-10-CM | POA: Insufficient documentation

## 2020-09-07 DIAGNOSIS — Z79899 Other long term (current) drug therapy: Secondary | ICD-10-CM | POA: Diagnosis not present

## 2020-09-07 DIAGNOSIS — D0502 Lobular carcinoma in situ of left breast: Secondary | ICD-10-CM

## 2020-09-07 DIAGNOSIS — D0512 Intraductal carcinoma in situ of left breast: Secondary | ICD-10-CM | POA: Insufficient documentation

## 2020-09-07 DIAGNOSIS — D72819 Decreased white blood cell count, unspecified: Secondary | ICD-10-CM | POA: Diagnosis not present

## 2020-09-07 DIAGNOSIS — Z170 Estrogen receptor positive status [ER+]: Secondary | ICD-10-CM | POA: Diagnosis not present

## 2020-09-07 DIAGNOSIS — F32A Depression, unspecified: Secondary | ICD-10-CM | POA: Insufficient documentation

## 2020-09-07 DIAGNOSIS — Z8616 Personal history of COVID-19: Secondary | ICD-10-CM | POA: Diagnosis not present

## 2020-09-07 LAB — CBC WITH DIFFERENTIAL/PLATELET
Abs Immature Granulocytes: 0.01 10*3/uL (ref 0.00–0.07)
Basophils Absolute: 0 10*3/uL (ref 0.0–0.1)
Basophils Relative: 1 %
Eosinophils Absolute: 0.1 10*3/uL (ref 0.0–0.5)
Eosinophils Relative: 2 %
HCT: 37.1 % (ref 36.0–46.0)
Hemoglobin: 12.5 g/dL (ref 12.0–15.0)
Immature Granulocytes: 0 %
Lymphocytes Relative: 40 %
Lymphs Abs: 1.4 10*3/uL (ref 0.7–4.0)
MCH: 31.4 pg (ref 26.0–34.0)
MCHC: 33.7 g/dL (ref 30.0–36.0)
MCV: 93.2 fL (ref 80.0–100.0)
Monocytes Absolute: 0.3 10*3/uL (ref 0.1–1.0)
Monocytes Relative: 9 %
Neutro Abs: 1.6 10*3/uL — ABNORMAL LOW (ref 1.7–7.7)
Neutrophils Relative %: 48 %
Platelets: ADEQUATE 10*3/uL (ref 150–400)
RBC: 3.98 MIL/uL (ref 3.87–5.11)
RDW: 12.6 % (ref 11.5–15.5)
WBC: 3.4 10*3/uL — ABNORMAL LOW (ref 4.0–10.5)
nRBC: 0 % (ref 0.0–0.2)

## 2020-09-07 LAB — COMPREHENSIVE METABOLIC PANEL
ALT: 16 U/L (ref 0–44)
AST: 16 U/L (ref 15–41)
Albumin: 4 g/dL (ref 3.5–5.0)
Alkaline Phosphatase: 37 U/L — ABNORMAL LOW (ref 38–126)
Anion gap: 7 (ref 5–15)
BUN: 12 mg/dL (ref 6–20)
CO2: 21 mmol/L — ABNORMAL LOW (ref 22–32)
Calcium: 9.2 mg/dL (ref 8.9–10.3)
Chloride: 107 mmol/L (ref 98–111)
Creatinine, Ser: 0.74 mg/dL (ref 0.44–1.00)
GFR, Estimated: >60 mL/min (ref >60)
Glucose, Bld: 89 mg/dL (ref 70–99)
Potassium: 3.8 mmol/L (ref 3.5–5.1)
Sodium: 135 mmol/L (ref 135–145)
Total Bilirubin: 0.7 mg/dL (ref 0.3–1.2)
Total Protein: 7.2 g/dL (ref 6.5–8.1)

## 2020-09-07 LAB — VITAMIN D 25 HYDROXY (VIT D DEFICIENCY, FRACTURES): Vit D, 25-Hydroxy: 11.86 ng/mL — ABNORMAL LOW (ref 30–100)

## 2020-09-13 ENCOUNTER — Other Ambulatory Visit (HOSPITAL_COMMUNITY): Payer: Self-pay

## 2020-09-13 MED FILL — Tamoxifen Citrate Tab 20 MG (Base Equivalent): ORAL | 30 days supply | Qty: 30 | Fill #2 | Status: AC

## 2020-09-14 ENCOUNTER — Encounter (HOSPITAL_COMMUNITY): Payer: Self-pay | Admitting: Hematology and Oncology

## 2020-09-14 ENCOUNTER — Other Ambulatory Visit: Payer: Self-pay

## 2020-09-14 ENCOUNTER — Inpatient Hospital Stay (HOSPITAL_BASED_OUTPATIENT_CLINIC_OR_DEPARTMENT_OTHER): Payer: No Typology Code available for payment source | Admitting: Hematology and Oncology

## 2020-09-14 DIAGNOSIS — D0502 Lobular carcinoma in situ of left breast: Secondary | ICD-10-CM | POA: Diagnosis not present

## 2020-09-14 DIAGNOSIS — D72819 Decreased white blood cell count, unspecified: Secondary | ICD-10-CM | POA: Diagnosis not present

## 2020-09-14 DIAGNOSIS — D0512 Intraductal carcinoma in situ of left breast: Secondary | ICD-10-CM | POA: Diagnosis not present

## 2020-09-14 DIAGNOSIS — E559 Vitamin D deficiency, unspecified: Secondary | ICD-10-CM | POA: Insufficient documentation

## 2020-09-14 NOTE — Assessment & Plan Note (Signed)
Cause is unknown, nonspecific I recommend recheck in 3 months

## 2020-09-14 NOTE — Assessment & Plan Note (Signed)
Examination is benign She tolerated tamoxifen well She will return in 3 months for further follow-up

## 2020-09-14 NOTE — Progress Notes (Signed)
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 female is seen today in the absence of her primary oncologist The patient had lumpectomy on the left breast She is currently taking tamoxifen for chemoprevention She tolerated treatment well No hot flashes She have regular menstruation She has history of abnormal Pap smear status post LEEP According to the patient, she was told she does not have invasive cancer She denies any recent abnormal breast examination, palpable mass, abnormal breast appearance or nipple changes  MEDICAL HISTORY:  Past Medical History:  Diagnosis Date  . Abnormal Pap smear of cervix   . Depression   . History of COVID-19 04/19/2020    SURGICAL HISTORY: Past Surgical History:  Procedure Laterality Date  . ADENOIDECTOMY    . BREAST LUMPECTOMY WITH RADIOACTIVE SEED LOCALIZATION Left 05/23/2020   Procedure: RADIOACTIVE SEED GUIDED LEFT BREAST LUMPECTOMY;  Surgeon: Jovita Kussmaul, MD;  Location: [facility];  Service: General;  Laterality: Left;    SOCIAL HISTORY: Social History   Socioeconomic History  . Marital status: Married    Spouse name: Not on file  . Number of children: Not on file  . Years of education: Not on file  . Highest education level: Not on file  Occupational History  . Not on file  Tobacco Use  . Smoking status: Never Smoker  . Smokeless tobacco: Never Used  Vaping Use  . Vaping Use: Never used  Substance and Sexual Activity  . Alcohol use: Never  . Drug use: Never  . Sexual activity: Not on file  Other Topics Concern  . Not on file  Social History Narrative  . Not on file   Social Determinants of Health   Financial Resource Strain: Low Risk   . Difficulty of Paying Living Expenses:  Not very hard  Food Insecurity: No Food Insecurity  . Worried About Charity fundraiser in the Last Year: Never true  . Ran Out of Food in the Last Year: Never true  Transportation Needs: No Transportation Needs  . Lack of Transportation (Medical): No  . Lack of Transportation (Non-Medical): No  Physical Activity: Inactive  . Days of Exercise per Week: 0 days  . Minutes of Exercise per Session: 0 min  Stress: No Stress Concern Present  . Feeling of Stress : Only a little  Social Connections: Moderately Integrated  . Frequency of Communication with Friends and Family: More than three times a week  . Frequency of Social Gatherings with Friends and Family: Three times a week  . Attends Religious Services: 1 to 4 times per year  . Active Member of Clubs or Organizations: No  . Attends Archivist Meetings: Never  . Marital Status: Married  Human resources officer Violence: Not At Risk  . Fear of Current or Ex-Partner: No  . Emotionally Abused: No  . Physically Abused: No  . Sexually Abused: No    FAMILY HISTORY: History reviewed. No pertinent family history.  ALLERGIES:  has No Known Allergies.  MEDICATIONS:  Current Outpatient Medications  Medication Sig Dispense Refill  . amphetamine-dextroamphetamine (ADDERALL) 30 MG tablet Take 30 mg by mouth 3 (three) times daily.    Marland Kitchen ibuprofen (ADVIL) 800 MG tablet TAKE 1 TABLET BY MOUTH EVERY 8 HOURS AFTER A MEAL 30  tablet 0  . losartan (COZAAR) 50 MG tablet Take 1 Tablet by mouth every day 90 tablet 0  . tamoxifen (NOLVADEX) 20 MG tablet TAKE 1 TABLET (20 MG TOTAL) BY MOUTH DAILY. 30 tablet 6  . venlafaxine XR (EFFEXOR-XR) 37.5 MG 24 hr capsule Take 1 capsule by mouth once day 30 capsule 0   No current facility-administered medications for this visit.    REVIEW OF SYSTEMS:   Constitutional: Denies fevers, chills or abnormal night sweats Eyes: Denies blurriness of vision, double vision or watery eyes Ears, nose, mouth, throat, and  face: Denies mucositis or sore throat Respiratory: Denies cough, dyspnea or wheezes Cardiovascular: Denies palpitation, chest discomfort or lower extremity swelling Gastrointestinal:  Denies nausea, heartburn or change in bowel habits Skin: Denies abnormal skin rashes Lymphatics: Denies new lymphadenopathy or easy bruising Neurological:Denies numbness, tingling or new weaknesses Behavioral/Psych: Mood is stable, no new changes  All other systems were reviewed with the patient and are negative.  PHYSICAL EXAMINATION: ECOG PERFORMANCE STATUS: 0 - Asymptomatic  Vitals:   09/14/20 1126  BP: 125/80  Pulse: 84  Resp: 18  Temp: 98.2 F (36.8 C)  SpO2: 100%   Filed Weights   09/14/20 1126  Weight: 171 lb (77.6 kg)    GENERAL:alert, no distress and comfortable SKIN: skin color, texture, turgor are normal, no rashes or significant lesions EYES: normal, conjunctiva are pink and non-injected, sclera clear OROPHARYNX:no exudate, normal lips, buccal mucosa, and tongue  NECK: supple, thyroid normal size, non-tender, without nodularity LYMPH:  no palpable lymphadenopathy in the cervical, axillary or inguinal LUNGS: clear to auscultation and percussion with normal breathing effort HEART: regular rate & rhythm and no murmurs without lower extremity edema ABDOMEN:abdomen soft, non-tender and normal bowel sounds Musculoskeletal:no cyanosis of digits and no clubbing  PSYCH: alert & oriented x 3 with fluent speech NEURO: no focal motor/sensory deficits Bilateral breast examination were performed.  Well-healed lumpectomy scar on the left.  No other abnormalities  LABORATORY DATA:  I have reviewed the data as listed Lab Results  Component Value Date   WBC 3.4 (L) 09/07/2020   HGB 12.5 09/07/2020   HCT 37.1 09/07/2020   MCV 93.2 09/07/2020   PLT  09/07/2020    PLATELET CLUMPS NOTED ON SMEAR, COUNT APPEARS ADEQUATE   Recent Labs    09/07/20 0903  NA 135  K 3.8  CL 107  CO2 21*   GLUCOSE 89  BUN 12  CREATININE 0.74  CALCIUM 9.2  GFRNONAA >60  PROT 7.2  ALBUMIN 4.0  AST 16  ALT 16  ALKPHOS 37*  BILITOT 0.7    ASSESSMENT & PLAN:  Lobular carcinoma in situ (LCIS) of left breast Examination is benign She tolerated tamoxifen well She will return in 3 months for further follow-up  Vitamin D deficiency Significant vitamin D deficiency I recommend over-the-counter vitamin D supplement 2000 units daily  Leukopenia Cause is unknown, nonspecific I recommend recheck in 3 months   No orders of the defined types were placed in this encounter.   All questions were answered. The patient knows to call the clinic with any problems, questions or concerns. The total time spent in the appointment was 20 minutes encounter with patients including review of chart and various tests results, discussions about plan of care and coordination of care plan   Heath Lark, MD 09/14/2020 11:52 AM

## 2020-09-14 NOTE — Addendum Note (Signed)
Addended by: Renda Rolls A on: 09/14/2020 12:25 PM   Modules accepted: Orders

## 2020-09-14 NOTE — Assessment & Plan Note (Signed)
Significant vitamin D deficiency I recommend over-the-counter vitamin D supplement 2000 units daily

## 2020-09-15 ENCOUNTER — Other Ambulatory Visit (HOSPITAL_COMMUNITY): Payer: Self-pay

## 2020-09-20 ENCOUNTER — Ambulatory Visit (HOSPITAL_COMMUNITY): Admission: RE | Admit: 2020-09-20 | Payer: No Typology Code available for payment source | Source: Ambulatory Visit

## 2020-10-17 ENCOUNTER — Other Ambulatory Visit (HOSPITAL_COMMUNITY): Payer: Self-pay

## 2020-10-17 MED FILL — Tamoxifen Citrate Tab 20 MG (Base Equivalent): ORAL | 30 days supply | Qty: 30 | Fill #3 | Status: AC

## 2020-10-18 ENCOUNTER — Ambulatory Visit: Payer: No Typology Code available for payment source | Admitting: Student

## 2020-10-27 ENCOUNTER — Other Ambulatory Visit (HOSPITAL_COMMUNITY): Payer: Self-pay

## 2020-10-27 MED ORDER — AMPHETAMINE-DEXTROAMPHETAMINE 30 MG PO TABS
30.0000 mg | ORAL_TABLET | Freq: Two times a day (BID) | ORAL | 0 refills | Status: DC
  Filled 2020-10-27 – 2020-11-02 (×3): qty 180, 90d supply, fill #0

## 2020-10-27 MED ORDER — AMPHETAMINE-DEXTROAMPHETAMINE 30 MG PO TABS
30.0000 mg | ORAL_TABLET | Freq: Every day | ORAL | 0 refills | Status: DC
  Filled 2020-10-27 – 2020-11-02 (×3): qty 90, 90d supply, fill #0

## 2020-11-02 ENCOUNTER — Other Ambulatory Visit (HOSPITAL_COMMUNITY): Payer: Self-pay

## 2020-11-29 ENCOUNTER — Other Ambulatory Visit (HOSPITAL_COMMUNITY): Payer: Self-pay

## 2020-11-29 MED ORDER — LOSARTAN POTASSIUM 50 MG PO TABS
50.0000 mg | ORAL_TABLET | Freq: Every day | ORAL | 0 refills | Status: DC
  Filled 2020-11-29: qty 90, 90d supply, fill #0

## 2020-11-29 MED FILL — Tamoxifen Citrate Tab 20 MG (Base Equivalent): ORAL | 30 days supply | Qty: 30 | Fill #4 | Status: AC

## 2020-12-20 ENCOUNTER — Inpatient Hospital Stay (HOSPITAL_COMMUNITY): Payer: No Typology Code available for payment source | Attending: Hematology

## 2020-12-20 ENCOUNTER — Other Ambulatory Visit: Payer: Self-pay

## 2020-12-20 DIAGNOSIS — E559 Vitamin D deficiency, unspecified: Secondary | ICD-10-CM | POA: Insufficient documentation

## 2020-12-20 DIAGNOSIS — D72819 Decreased white blood cell count, unspecified: Secondary | ICD-10-CM | POA: Insufficient documentation

## 2020-12-20 DIAGNOSIS — Z79810 Long term (current) use of selective estrogen receptor modulators (SERMs): Secondary | ICD-10-CM | POA: Diagnosis not present

## 2020-12-20 DIAGNOSIS — Z170 Estrogen receptor positive status [ER+]: Secondary | ICD-10-CM | POA: Diagnosis not present

## 2020-12-20 DIAGNOSIS — Z79899 Other long term (current) drug therapy: Secondary | ICD-10-CM | POA: Insufficient documentation

## 2020-12-20 DIAGNOSIS — D0502 Lobular carcinoma in situ of left breast: Secondary | ICD-10-CM

## 2020-12-20 DIAGNOSIS — C50412 Malignant neoplasm of upper-outer quadrant of left female breast: Secondary | ICD-10-CM | POA: Diagnosis not present

## 2020-12-20 LAB — CBC WITH DIFFERENTIAL/PLATELET
Abs Immature Granulocytes: 0.01 10*3/uL (ref 0.00–0.07)
Basophils Absolute: 0.1 10*3/uL (ref 0.0–0.1)
Basophils Relative: 1 %
Eosinophils Absolute: 0.1 10*3/uL (ref 0.0–0.5)
Eosinophils Relative: 2 %
HCT: 38.3 % (ref 36.0–46.0)
Hemoglobin: 13 g/dL (ref 12.0–15.0)
Immature Granulocytes: 0 %
Lymphocytes Relative: 39 %
Lymphs Abs: 1.5 10*3/uL (ref 0.7–4.0)
MCH: 32.3 pg (ref 26.0–34.0)
MCHC: 33.9 g/dL (ref 30.0–36.0)
MCV: 95.3 fL (ref 80.0–100.0)
Monocytes Absolute: 0.3 10*3/uL (ref 0.1–1.0)
Monocytes Relative: 8 %
Neutro Abs: 1.9 10*3/uL (ref 1.7–7.7)
Neutrophils Relative %: 50 %
Platelets: 337 10*3/uL (ref 150–400)
RBC: 4.02 MIL/uL (ref 3.87–5.11)
RDW: 13.2 % (ref 11.5–15.5)
WBC: 3.8 10*3/uL — ABNORMAL LOW (ref 4.0–10.5)
nRBC: 0 % (ref 0.0–0.2)

## 2020-12-20 LAB — COMPREHENSIVE METABOLIC PANEL
ALT: 14 U/L (ref 0–44)
AST: 16 U/L (ref 15–41)
Albumin: 3.9 g/dL (ref 3.5–5.0)
Alkaline Phosphatase: 42 U/L (ref 38–126)
Anion gap: 10 (ref 5–15)
BUN: 8 mg/dL (ref 6–20)
CO2: 22 mmol/L (ref 22–32)
Calcium: 8.8 mg/dL — ABNORMAL LOW (ref 8.9–10.3)
Chloride: 106 mmol/L (ref 98–111)
Creatinine, Ser: 0.65 mg/dL (ref 0.44–1.00)
GFR, Estimated: >60 mL/min (ref >60)
Glucose, Bld: 85 mg/dL (ref 70–99)
Potassium: 4.5 mmol/L (ref 3.5–5.1)
Sodium: 138 mmol/L (ref 135–145)
Total Bilirubin: 0.4 mg/dL (ref 0.3–1.2)
Total Protein: 6.7 g/dL (ref 6.5–8.1)

## 2020-12-20 LAB — VITAMIN D 25 HYDROXY (VIT D DEFICIENCY, FRACTURES): Vit D, 25-Hydroxy: 8.95 ng/mL — ABNORMAL LOW (ref 30–100)

## 2020-12-26 NOTE — Progress Notes (Signed)
[facility] [address] [place] [address]   Patient Care Team: Caryl Bis, MD as PCP - General (Family Medicine)  SUMMARY OF ONCOLOGIC HISTORY: Oncology History   No history exists.    CHIEF COMPLIANT: Follow-up for left breast LCIS   INTERVAL HISTORY: Erika Galloway is a 45 y.o. female here today for follow up of her left breast LCIS. Her last visit was on 06/14/2020.   Today she reports feeling well. She is taking tamoxifen and tolerating it well. She reports she gained 20 pounds, and then lost that 20 ponds intentionally with intermittent fasting and Keto starting in May. She reports occasional mild palpitations that last 15-20 minutes, and she denies CP.   REVIEW OF SYSTEMS:   Review of Systems  Constitutional:  Negative for appetite change, fatigue and unexpected weight change.  Cardiovascular:  Positive for palpitations. Negative for chest pain.  All other systems reviewed and are negative.  I have reviewed the past medical history, past surgical history, social history and family history with the patient and they are unchanged from previous note.   ALLERGIES:   has No Known Allergies.   MEDICATIONS:  Current Outpatient Medications  Medication Sig Dispense Refill   amphetamine-dextroamphetamine (ADDERALL) 30 MG tablet Take 30 mg by mouth 3 (three) times daily.     amphetamine-dextroamphetamine (ADDERALL) 30 MG tablet Take 1 tablet by mouth 2 (two) times daily. 180 tablet 0   amphetamine-dextroamphetamine (ADDERALL) 30 MG tablet Take 1 tablet by mouth daily. 90 tablet 0   Cholecalciferol (VITAMIN D) 50 MCG (2000 UT) CAPS Take by mouth.     ibuprofen (ADVIL) 800 MG tablet TAKE 1 TABLET BY MOUTH EVERY 8 HOURS AFTER A MEAL 30 tablet 0   losartan (COZAAR) 50 MG tablet Take 1 tablet (50 mg total) by mouth daily. 90 tablet 0   tamoxifen (NOLVADEX) 20 MG tablet TAKE 1 TABLET (20 MG TOTAL) BY MOUTH DAILY. 30 tablet 6   venlafaxine XR  (EFFEXOR-XR) 37.5 MG 24 hr capsule Take 1 capsule by mouth once day 30 capsule 0   No current facility-administered medications for this visit.     PHYSICAL EXAMINATION: Performance status (ECOG): 0 - Asymptomatic  There were no vitals filed for this visit. Wt Readings from Last 3 Encounters:  09/14/20 171 lb (77.6 kg)  08/17/20 178 lb (80.7 kg)  06/14/20 179 lb 4.8 oz (81.3 kg)   Physical Exam Vitals reviewed.  Constitutional:      Appearance: Normal appearance.  Cardiovascular:     Rate and Rhythm: Normal rate and regular rhythm.     Pulses: Normal pulses.     Heart sounds: Normal heart sounds.  Pulmonary:     Effort: Pulmonary effort is normal.     Breath sounds: Normal breath sounds.  Chest:  Breasts:    Right: Normal. No inverted nipple, mass, nipple discharge, skin change or tenderness.     Left: Normal. No inverted nipple, mass, nipple discharge, skin change or tenderness.  Abdominal:     Palpations: Abdomen is soft. There is no hepatomegaly, splenomegaly or mass.     Tenderness: There is no abdominal tenderness.  Musculoskeletal:     Right lower leg: No edema.     Left lower leg: No edema.  Neurological:     General: No focal deficit present.     Mental Status: She is alert and oriented to person, place, and time.  Psychiatric:  Mood and Affect: Mood normal.        Behavior: Behavior normal.    Breast Exam Chaperone: Erika Galloway     LABORATORY DATA:  I have reviewed the data as listed CMP Latest Ref Rng & Units 12/20/2020 09/07/2020 06/28/2007  Glucose 70 - 99 mg/dL 85 89 76  BUN 6 - 20 mg/dL 8 12 2(L)  Creatinine 0.44 - 1.00 mg/dL 0.65 0.74 0.57  Sodium 135 - 145 mmol/L 138 135 134(L)  Potassium 3.5 - 5.1 mmol/L 4.5 3.8 3.6  Chloride 98 - 111 mmol/L 106 107 109  CO2 22 - 32 mmol/L 22 21(L) 21  Calcium 8.9 - 10.3 mg/dL 8.8(L) 9.2 8.3(L)  Total Protein 6.5 - 8.1 g/dL 6.7 7.2 5.4(L)  Total Bilirubin 0.3 - 1.2 mg/dL 0.4 0.7 0.7  Alkaline Phos 38 -  126 U/L 42 37(L) 106  AST 15 - 41 U/L $Remo'16 16 15  'LzXCn$ ALT 0 - 44 U/L $Remo'14 16 10   'cAPmY$ No results found for: IFO277 Lab Results  Component Value Date   WBC 3.8 (L) 12/20/2020   HGB 13.0 12/20/2020   HCT 38.3 12/20/2020   MCV 95.3 12/20/2020   PLT 337 12/20/2020   NEUTROABS 1.9 12/20/2020    ASSESSMENT:  1.  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 -Left breast biopsy, 1 o'clock position on 04/07/2020-LCIS involving a fibroadenoma. -MRI of the breast on 05/11/2020 with 6 mm enhancing mass in the posterior upper outer left breast.  No evidence of malignant cells within the breast.  No axillary lymph nodes. -Left lumpectomy on 05/23/2020 with 0.6 cm LCIS involving a fibroadenoma, LCIS focally less than 0.1 cm from lateral margin.  ER 70% positive, PR 90% positive, HER-2 negative.  No invasive component present. - Tamoxifen started on 06/14/2020.   2.  Social/family history: -She works as a Tourist information centre manager at [employer]. 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  No malignancies on her mother side.   PLAN:  1.  LCIS of the upper outer quadrant of left breast: - She is tolerating tamoxifen reasonably well.  She has gained some weight. - She started doing intermittent fasting with keto diet and improved her weight to her baseline. - Physical examination today did not reveal any palpable masses. - Reviewed labs which showed normal CBC with mild leukopenia and normal ANC.  LFTs are normal. - We will schedule her mammogram in December.  RTC 6 months with labs. - She has intermittent palpitations even at rest.  She was recommended to follow-up with cardiology.  2.  Vitamin D deficiency: - Vitamin D is severely low at 8.9.  We will send prescription for vitamin D  50,000 units weekly.  Breast Cancer therapy associated bone loss: I have recommended calcium, Vitamin D and weight bearing exercises.  Orders placed this encounter:  No orders of the defined types were placed in this encounter.   The patient has a good understanding of the overall plan. She agrees with it. She will call with any problems that may develop before the next visit here.  Derek Jack, MD [facility] [phone]   I, Erika Galloway, am acting as a scribe for Dr. Derek Jack.  Kinnie Scales MD, have reviewed  the above documentation for accuracy and completeness, and I agree with the above.

## 2020-12-27 ENCOUNTER — Other Ambulatory Visit (HOSPITAL_COMMUNITY): Payer: Self-pay | Admitting: Hematology

## 2020-12-27 ENCOUNTER — Other Ambulatory Visit (HOSPITAL_COMMUNITY): Payer: Self-pay

## 2020-12-27 ENCOUNTER — Other Ambulatory Visit: Payer: Self-pay

## 2020-12-27 ENCOUNTER — Inpatient Hospital Stay (HOSPITAL_BASED_OUTPATIENT_CLINIC_OR_DEPARTMENT_OTHER): Payer: No Typology Code available for payment source | Admitting: Hematology

## 2020-12-27 ENCOUNTER — Other Ambulatory Visit (HOSPITAL_COMMUNITY): Payer: Self-pay | Admitting: *Deleted

## 2020-12-27 VITALS — BP 128/71 | HR 100 | Temp 99.3°F | Resp 18 | Wt 162.8 lb

## 2020-12-27 DIAGNOSIS — E559 Vitamin D deficiency, unspecified: Secondary | ICD-10-CM

## 2020-12-27 DIAGNOSIS — D0502 Lobular carcinoma in situ of left breast: Secondary | ICD-10-CM

## 2020-12-27 DIAGNOSIS — C50412 Malignant neoplasm of upper-outer quadrant of left female breast: Secondary | ICD-10-CM | POA: Diagnosis not present

## 2020-12-27 MED ORDER — ERGOCALCIFEROL 1.25 MG (50000 UT) PO CAPS
50000.0000 [IU] | ORAL_CAPSULE | ORAL | 2 refills | Status: DC
  Filled 2020-12-27: qty 12, 84d supply, fill #0
  Filled 2021-03-23: qty 12, 84d supply, fill #1

## 2020-12-27 MED ORDER — TAMOXIFEN CITRATE 20 MG PO TABS
20.0000 mg | ORAL_TABLET | Freq: Every day | ORAL | 6 refills | Status: DC
  Filled 2020-12-27: qty 30, 30d supply, fill #0
  Filled 2021-02-09: qty 30, 30d supply, fill #1
  Filled 2021-03-23: qty 30, 30d supply, fill #2

## 2020-12-27 NOTE — Patient Instructions (Addendum)
[facility] at [facility] Discharge Instructions  You were seen today by Dr. Delton Coombes. He went over your recent results. You been prescribed 50,000 unit Vitamin D tablets: take 1 tablet every week. You will be scheduled for a mammogram after 03/29/2021. Dr. Delton Coombes will see you back in 6 months for labs and follow up.   Thank you for choosing [facility] at [facility] to provide your oncology and hematology care.  To afford each patient quality time with our provider, please arrive at least 15 minutes before your scheduled appointment time.   If you have a lab appointment with the [facility] please come in thru the Main Entrance and check in at the main information desk  You need to re-schedule your appointment should you arrive 10 or more minutes late.  We strive to give you quality time with our providers, and arriving late affects you and other patients whose appointments are after yours.  Also, if you no show three or more times for appointments you may be dismissed from the clinic at the providers discretion.     Again, thank you for choosing [facility].  Our hope is that these requests will decrease the amount of time that you wait before being seen by our physicians.       _____________________________________________________________  Should you have questions after your visit to [facility], please contact our office at [phone] between the hours of 8:00 a.m. and 4:30 p.m.  Voicemails left after 4:00 p.m. will not be returned until the following business day.  For prescription refill requests, have your pharmacy contact our office and allow 72 hours.    Cancer Center Support Programs:   > Cancer Support Group  2nd Tuesday of the month 1pm-2pm, Journey Room

## 2021-01-31 NOTE — Progress Notes (Signed)
Cardiology Office Note    Date:  02/07/2021   ID:  Erika Galloway, DOB February 12, 1976, MRN [id number]   PCP:  Erika Bis, MD   [facility]  Cardiologist:  Erika Dolly, MD   Advanced Practice Provider:  No care team member to display Electrophysiologist:  None   [phone]   Chief Complaint  Patient presents with   Follow-up     History of Present Illness:  Erika Galloway is a 45 y.o. female with history of breast CVA who saw Erika Galloway 07/2020 with palpitations and sinus tachycardia.  On Adderall for several years without changes.7-day monitor did not show any significant arrhythmias.  She had a heart murmur and 2D echo ordered but not done.  Patient comes in for f/u. Still having some palpitations and occasionally her watch says her HR 120/m, worse when anxious. She drinks  1 cup coffee in am and occasional cup in the afternoon. Dark chocolate also a trigger. Doesn't last long. Hasn't had echo yet but wants to schedule. No associated symptoms.  Took Mucinex D last night and had some palpitations.    Past Medical History:  Diagnosis Date   Abnormal Pap smear of cervix    Depression    History of COVID-19 04/19/2020    Past Surgical History:  Procedure Laterality Date   ADENOIDECTOMY     BREAST LUMPECTOMY WITH RADIOACTIVE SEED LOCALIZATION Left 05/23/2020   Procedure: RADIOACTIVE SEED GUIDED LEFT BREAST LUMPECTOMY;  Surgeon: Erika Kussmaul, MD;  Location: [facility];  Service: General;  Laterality: Left;    Current Medications: No outpatient medications have been marked as taking for the 02/07/21 encounter (Office Visit) with Erika Burn, PA-C.     Allergies:   Patient has no known allergies.   Social History   Socioeconomic History   Marital status: Married    Spouse name: Not on file   Number of children: Not on file   Years of education: Not on file   Highest education level: Not on file  Occupational History    Not on file  Tobacco Use   Smoking status: Never   Smokeless tobacco: Never  Vaping Use   Vaping Use: Never used  Substance and Sexual Activity   Alcohol use: Never   Drug use: Never   Sexual activity: Not on file  Other Topics Concern   Not on file  Social History Narrative   Not on file   Social Determinants of Health   Financial Resource Strain: Low Risk    Difficulty of Paying Living Expenses: Not very hard  Food Insecurity: No Food Insecurity   Worried About Running Out of Food in the Last Year: Never true   Supreme in the Last Year: Never true  Transportation Needs: No Transportation Needs   Lack of Transportation (Medical): No   Lack of Transportation (Non-Medical): No  Physical Activity: Inactive   Days of Exercise per Week: 0 days   Minutes of Exercise per Session: 0 min  Stress: No Stress Concern Present   Feeling of Stress : Only a little  Social Connections: Moderately Integrated   Frequency of Communication with Friends and Family: More than three times a week   Frequency of Social Gatherings with Friends and Family: Three times a week   Attends Religious Services: 1 to 4 times per year   Active Member of Clubs or Organizations: No   Attends Archivist Meetings:  Never   Marital Status: Married     Family History:  The patient's  family history includes Supraventricular tachycardia in her mother.   ROS:   Please see the history of present illness.    ROS All other systems reviewed and are negative.   PHYSICAL EXAM:   VS:  BP 118/70   Pulse 84   Ht 5\' 5"  (1.651 m)   Wt 161 lb 12.8 oz (73.4 kg)   SpO2 100%   BMI 26.92 kg/m   Physical Exam  GEN: Well nourished, well developed, in no acute distress  Neck: no JVD, carotid bruits, or masses Cardiac:RRR; 1/6 systolic murmur at the left sternal border Respiratory:  clear to auscultation bilaterally, normal work of breathing GI: soft, nontender, nondistended, + BS Ext: without cyanosis,  clubbing, or edema, Good distal pulses bilaterally Neuro:  Alert and Oriented x 3 Psych: euthymic mood, full affect  Wt Readings from Last 3 Encounters:  02/07/21 161 lb 12.8 oz (73.4 kg)  12/27/20 162 lb 12.8 oz (73.8 kg)  09/14/20 171 lb (77.6 kg)      Studies/Labs Reviewed:   EKG:  EKG is not ordered today.     Recent Labs: 12/20/2020: ALT 14; BUN 8; Creatinine, Ser 0.65; Hemoglobin 13.0; Platelets 337; Potassium 4.5; Sodium 138   Lipid Panel    Component Value Date/Time   CHOL 249 (H) 02/06/2019 0916   TRIG 177 (H) 02/06/2019 0916   HDL 90 02/06/2019 0916   CHOLHDL 2.8 02/06/2019 0916   VLDL 35 02/06/2019 0916   LDLCALC 124 (H) 02/06/2019 0916    Additional studies/ records that were reviewed today include:  Holter monitor 08/2020  Rare supraventricular ectopy Rare ventricular ectopy Reported symptoms correlated with sinus tachycardia No significant arrhythmias     Patch Wear Time:  5 days and 12 hours (2022-05-11T09:49:33-0400 to 2022-05-16T22:13:25-0400)   Patient had a min HR of 69 bpm, max HR of 180 bpm, and avg HR of 106 bpm. Predominant underlying rhythm was Sinus Rhythm. Isolated SVEs were rare (<1.0%), SVE Couplets were rare (<1.0%), and SVE Triplets were rare (<1.0%). Isolated VEs were rare (<1.0%),  and no VE Couplets or VE Triplets were present.     Risk Assessment/Calculations:         ASSESSMENT:    1. Palpitations   2. Tachycardia   3. Lobular carcinoma in situ (LCIS) of left breast      PLAN:  In order of problems listed above:  Palpitations/sinus tachycardia with monitor 08/2020 predominantly normal sinus rhythm with rare PACs and PVCs average heart rate 106 bpm.  Patient still having palpitations but definite triggers are anxiety, caffeine and dark chocolate.  No sustained fast heart rates.  I asked her to decrease caffeine.  She is also on Adderall which could contribute.  She took Mucinex D last night which was also a trigger.  Avoid  decongestant..  2D echo ordered but never done.  She would like to schedule that today  Heart murmur patient to schedule echo  Breast CA  Shared Decision Making/Informed Consent        Medication Adjustments/Labs and Tests Ordered: Current medicines are reviewed at length with the patient today.  Concerns regarding medicines are outlined above.  Medication changes, Labs and Tests ordered today are listed in the Patient Instructions below. Patient Instructions  Medication Instructions:  Your physician recommends that you continue on your current medications as directed. Please refer to the Current Medication list given to you today.  *  If you need a refill on your cardiac medications before your next appointment, please call your pharmacy*   Lab Work: None today  If you have labs (blood work) drawn today and your tests are completely normal, you will receive your results only by: [place] (if you have MyChart) OR A paper copy in the mail If you have any lab test that is abnormal or we need to change your treatment, we will call you to review the results.   Testing/Procedures: Your physician has requested that you have an echocardiogram. Echocardiography is a painless test that uses sound waves to create images of your heart. It provides your doctor with information about the size and shape of your heart and how well your heart's chambers and valves are working. This procedure takes approximately one hour. There are no restrictions for this procedure.    Follow-Up: At [facility], you and your health needs are our priority.  As part of our continuing mission to provide you with exceptional heart care, we have created designated Provider Care Teams.  These Care Teams include your primary Cardiologist (physician) and Advanced Practice Providers (APPs -  Physician Assistants and Nurse Practitioners) who all work together to provide you with the care you need, when you need  it.  We recommend signing up for the patient portal called "MyChart".  Sign up information is provided on this After Visit Summary.  MyChart is used to connect with patients for Virtual Visits (Telemedicine).  Patients are able to view lab/test results, encounter notes, upcoming appointments, etc.  Non-urgent messages can be sent to your provider as well.   To learn more about what you can do with MyChart, go to [email or web address].    Your next appointment:   3 -4 month(s)  The format for your next appointment:   In Person  Provider:   Carlyle Dolly, MD   Other Instructions:     Avoid caffeine    Signed, Ermalinda Barrios, PA-C  02/07/2021 1:05 PM    [facility] [address], [place], [place]  [address] Phone: [phone]; Fax: [phone]

## 2021-02-07 ENCOUNTER — Encounter: Payer: Self-pay | Admitting: Physician Assistant

## 2021-02-07 ENCOUNTER — Ambulatory Visit (INDEPENDENT_AMBULATORY_CARE_PROVIDER_SITE_OTHER): Payer: No Typology Code available for payment source | Admitting: Physician Assistant

## 2021-02-07 ENCOUNTER — Other Ambulatory Visit: Payer: Self-pay

## 2021-02-07 VITALS — BP 118/70 | HR 84 | Ht 65.0 in | Wt 161.8 lb

## 2021-02-07 DIAGNOSIS — R000 Tachycardia, unspecified: Secondary | ICD-10-CM

## 2021-02-07 DIAGNOSIS — R002 Palpitations: Secondary | ICD-10-CM | POA: Diagnosis not present

## 2021-02-07 DIAGNOSIS — D0502 Lobular carcinoma in situ of left breast: Secondary | ICD-10-CM

## 2021-02-07 NOTE — Patient Instructions (Signed)
Medication Instructions:  Your physician recommends that you continue on your current medications as directed. Please refer to the Current Medication list given to you today.  *If you need a refill on your cardiac medications before your next appointment, please call your pharmacy*   Lab Work: None today  If you have labs (blood work) drawn today and your tests are completely normal, you will receive your results only by: [facility] (if you have MyChart) OR A paper copy in the mail If you have any lab test that is abnormal or we need to change your treatment, we will call you to review the results.   Testing/Procedures: Your physician has requested that you have an echocardiogram. Echocardiography is a painless test that uses sound waves to create images of your heart. It provides your doctor with information about the size and shape of your heart and how well your heart's chambers and valves are working. This procedure takes approximately one hour. There are no restrictions for this procedure.    Follow-Up: At [facility], you and your health needs are our priority.  As part of our continuing mission to provide you with exceptional heart care, we have created designated Provider Care Teams.  These Care Teams include your primary Cardiologist (physician) and Advanced Practice Providers (APPs -  Physician Assistants and Nurse Practitioners) who all work together to provide you with the care you need, when you need it.  We recommend signing up for the patient portal called "MyChart".  Sign up information is provided on this After Visit Summary.  MyChart is used to connect with patients for Virtual Visits (Telemedicine).  Patients are able to view lab/test results, encounter notes, upcoming appointments, etc.  Non-urgent messages can be sent to your provider as well.   To learn more about what you can do with MyChart, go to [email or web address].    Your next appointment:   3 -4  month(s)  The format for your next appointment:   In Person  Provider:   Carlyle Dolly, MD   Other Instructions:     Avoid caffeine

## 2021-02-09 ENCOUNTER — Other Ambulatory Visit (HOSPITAL_COMMUNITY): Payer: Self-pay

## 2021-02-10 ENCOUNTER — Other Ambulatory Visit: Payer: Self-pay | Admitting: Obstetrics & Gynecology

## 2021-03-06 ENCOUNTER — Other Ambulatory Visit (HOSPITAL_COMMUNITY): Payer: Self-pay

## 2021-03-06 MED ORDER — AMPHETAMINE-DEXTROAMPHETAMINE 30 MG PO TABS
30.0000 mg | ORAL_TABLET | Freq: Two times a day (BID) | ORAL | 0 refills | Status: DC
  Filled 2021-03-06: qty 180, 90d supply, fill #0

## 2021-03-06 MED ORDER — AMPHETAMINE-DEXTROAMPHETAMINE 30 MG PO TABS
30.0000 mg | ORAL_TABLET | Freq: Every evening | ORAL | 0 refills | Status: DC
  Filled 2021-03-06: qty 90, 90d supply, fill #0
  Filled 2021-07-28: qty 30, 30d supply, fill #0

## 2021-03-22 ENCOUNTER — Ambulatory Visit (HOSPITAL_COMMUNITY)
Admission: RE | Admit: 2021-03-22 | Discharge: 2021-03-22 | Disposition: A | Discharge status: Home / Self Care | Payer: No Typology Code available for payment source | Source: Ambulatory Visit | Attending: Physician Assistant | Admitting: Physician Assistant

## 2021-03-22 DIAGNOSIS — R002 Palpitations: Secondary | ICD-10-CM | POA: Diagnosis not present

## 2021-03-22 DIAGNOSIS — I10 Essential (primary) hypertension: Secondary | ICD-10-CM | POA: Diagnosis not present

## 2021-03-22 DIAGNOSIS — R000 Tachycardia, unspecified: Secondary | ICD-10-CM | POA: Diagnosis not present

## 2021-03-22 LAB — ECHOCARDIOGRAM COMPLETE
Area-P 1/2: 4.8 cm2
S' Lateral: 1.8 cm

## 2021-03-22 NOTE — Progress Notes (Signed)
*  PRELIMINARY RESULTS* Echocardiogram 2D Echocardiogram has been performed.  Samuel Germany 03/22/2021, 2:42 PM

## 2021-03-23 ENCOUNTER — Other Ambulatory Visit (HOSPITAL_COMMUNITY): Payer: Self-pay

## 2021-03-23 ENCOUNTER — Telehealth: Payer: Self-pay

## 2021-03-23 NOTE — Telephone Encounter (Signed)
-----   Message from [name], PA-C sent at 03/23/2021  7:57 AM EST ----- Echo looks good. Normal heart function, minimal leaking of valves-heart murmur. No changes

## 2021-03-23 NOTE — Telephone Encounter (Signed)
Patient notified and verbalized understanding. Pt had no questions or concerns at this time. PCP copied.  ?

## 2021-03-24 ENCOUNTER — Other Ambulatory Visit (HOSPITAL_COMMUNITY): Payer: Self-pay

## 2021-03-24 MED ORDER — LOSARTAN POTASSIUM 50 MG PO TABS
50.0000 mg | ORAL_TABLET | Freq: Every day | ORAL | 0 refills | Status: DC
  Filled 2021-03-24: qty 90, 90d supply, fill #0

## 2021-03-31 ENCOUNTER — Other Ambulatory Visit (HOSPITAL_COMMUNITY): Payer: Self-pay

## 2021-04-04 ENCOUNTER — Ambulatory Visit (HOSPITAL_COMMUNITY)
Admission: RE | Admit: 2021-04-04 | Discharge: 2021-04-04 | Disposition: A | Discharge status: Home / Self Care | Payer: No Typology Code available for payment source | Source: Ambulatory Visit | Attending: Hematology | Admitting: Hematology

## 2021-04-04 ENCOUNTER — Other Ambulatory Visit (HOSPITAL_COMMUNITY): Payer: No Typology Code available for payment source

## 2021-04-04 ENCOUNTER — Other Ambulatory Visit: Payer: Self-pay

## 2021-04-04 ENCOUNTER — Ambulatory Visit (HOSPITAL_COMMUNITY): Payer: No Typology Code available for payment source

## 2021-04-04 DIAGNOSIS — D0502 Lobular carcinoma in situ of left breast: Secondary | ICD-10-CM

## 2021-04-10 ENCOUNTER — Other Ambulatory Visit (HOSPITAL_COMMUNITY): Payer: Self-pay

## 2021-05-11 ENCOUNTER — Ambulatory Visit: Payer: No Typology Code available for payment source | Admitting: Cardiology

## 2021-05-18 ENCOUNTER — Other Ambulatory Visit (HOSPITAL_COMMUNITY): Payer: Self-pay

## 2021-05-18 MED ORDER — LOSARTAN POTASSIUM 50 MG PO TABS
50.0000 mg | ORAL_TABLET | Freq: Every day | ORAL | 0 refills | Status: DC
  Filled 2021-05-18: qty 90, 90d supply, fill #0

## 2021-05-18 MED ORDER — AMPHETAMINE-DEXTROAMPHETAMINE 30 MG PO TABS
30.0000 mg | ORAL_TABLET | Freq: Every evening | ORAL | 0 refills | Status: DC
Start: 2021-05-18 — End: 2022-02-01
  Filled 2021-05-18: qty 90, 90d supply, fill #0
  Filled 2021-05-18: qty 30, 30d supply, fill #0
  Filled 2021-05-18: qty 90, 90d supply, fill #0

## 2021-05-18 MED ORDER — AMPHETAMINE-DEXTROAMPHETAMINE 30 MG PO TABS
30.0000 mg | ORAL_TABLET | Freq: Two times a day (BID) | ORAL | 0 refills | Status: DC
  Filled 2021-05-18: qty 180, 90d supply, fill #0
  Filled 2021-06-01: qty 30, 15d supply, fill #0
  Filled 2021-06-01: qty 150, 75d supply, fill #0

## 2021-06-01 ENCOUNTER — Other Ambulatory Visit (HOSPITAL_COMMUNITY): Payer: Self-pay

## 2021-06-22 ENCOUNTER — Other Ambulatory Visit (HOSPITAL_COMMUNITY): Payer: Self-pay

## 2021-06-22 ENCOUNTER — Other Ambulatory Visit (HOSPITAL_COMMUNITY): Payer: Self-pay | Admitting: Hematology

## 2021-06-22 MED ORDER — TAMOXIFEN CITRATE 20 MG PO TABS
20.0000 mg | ORAL_TABLET | Freq: Every day | ORAL | 6 refills | Status: DC
  Filled 2021-06-22: qty 30, 30d supply, fill #0
  Filled 2021-09-28: qty 30, 30d supply, fill #1
  Filled 2022-01-23: qty 30, 30d supply, fill #2
  Filled 2022-04-24 – 2022-05-03 (×2): qty 30, 30d supply, fill #3

## 2021-06-26 ENCOUNTER — Inpatient Hospital Stay (HOSPITAL_COMMUNITY): Payer: No Typology Code available for payment source | Attending: Hematology

## 2021-06-26 DIAGNOSIS — D0502 Lobular carcinoma in situ of left breast: Secondary | ICD-10-CM | POA: Insufficient documentation

## 2021-06-26 DIAGNOSIS — E559 Vitamin D deficiency, unspecified: Secondary | ICD-10-CM | POA: Insufficient documentation

## 2021-06-26 DIAGNOSIS — Z79810 Long term (current) use of selective estrogen receptor modulators (SERMs): Secondary | ICD-10-CM | POA: Diagnosis not present

## 2021-06-26 DIAGNOSIS — Z79899 Other long term (current) drug therapy: Secondary | ICD-10-CM | POA: Diagnosis not present

## 2021-06-26 LAB — CBC WITH DIFFERENTIAL/PLATELET
Abs Immature Granulocytes: 0.01 10*3/uL (ref 0.00–0.07)
Basophils Absolute: 0 10*3/uL (ref 0.0–0.1)
Basophils Relative: 1 %
Eosinophils Absolute: 0.1 10*3/uL (ref 0.0–0.5)
Eosinophils Relative: 3 %
HCT: 37.6 % (ref 36.0–46.0)
Hemoglobin: 12.5 g/dL (ref 12.0–15.0)
Immature Granulocytes: 0 %
Lymphocytes Relative: 42 %
Lymphs Abs: 1.6 10*3/uL (ref 0.7–4.0)
MCH: 31.3 pg (ref 26.0–34.0)
MCHC: 33.2 g/dL (ref 30.0–36.0)
MCV: 94.2 fL (ref 80.0–100.0)
Monocytes Absolute: 0.3 10*3/uL (ref 0.1–1.0)
Monocytes Relative: 9 %
Neutro Abs: 1.7 10*3/uL (ref 1.7–7.7)
Neutrophils Relative %: 45 %
Platelets: 434 10*3/uL — ABNORMAL HIGH (ref 150–400)
RBC: 3.99 MIL/uL (ref 3.87–5.11)
RDW: 12.7 % (ref 11.5–15.5)
WBC: 3.8 10*3/uL — ABNORMAL LOW (ref 4.0–10.5)
nRBC: 0 % (ref 0.0–0.2)

## 2021-06-26 LAB — COMPREHENSIVE METABOLIC PANEL
ALT: 15 U/L (ref 0–44)
AST: 19 U/L (ref 15–41)
Albumin: 4.2 g/dL (ref 3.5–5.0)
Alkaline Phosphatase: 49 U/L (ref 38–126)
Anion gap: 10 (ref 5–15)
BUN: 14 mg/dL (ref 6–20)
CO2: 24 mmol/L (ref 22–32)
Calcium: 9.4 mg/dL (ref 8.9–10.3)
Chloride: 104 mmol/L (ref 98–111)
Creatinine, Ser: 0.7 mg/dL (ref 0.44–1.00)
GFR, Estimated: >60 mL/min (ref >60)
Glucose, Bld: 92 mg/dL (ref 70–99)
Potassium: 4.1 mmol/L (ref 3.5–5.1)
Sodium: 138 mmol/L (ref 135–145)
Total Bilirubin: 0.4 mg/dL (ref 0.3–1.2)
Total Protein: 7.7 g/dL (ref 6.5–8.1)

## 2021-06-26 LAB — VITAMIN D 25 HYDROXY (VIT D DEFICIENCY, FRACTURES): Vit D, 25-Hydroxy: 58.06 ng/mL (ref 30–100)

## 2021-07-03 ENCOUNTER — Inpatient Hospital Stay (HOSPITAL_BASED_OUTPATIENT_CLINIC_OR_DEPARTMENT_OTHER): Payer: No Typology Code available for payment source | Admitting: Hematology

## 2021-07-03 ENCOUNTER — Other Ambulatory Visit: Payer: Self-pay

## 2021-07-03 VITALS — BP 135/90 | HR 103 | Temp 99.1°F | Resp 19 | Ht 65.0 in | Wt 159.0 lb

## 2021-07-03 DIAGNOSIS — D0502 Lobular carcinoma in situ of left breast: Secondary | ICD-10-CM | POA: Diagnosis not present

## 2021-07-03 DIAGNOSIS — E559 Vitamin D deficiency, unspecified: Secondary | ICD-10-CM | POA: Diagnosis not present

## 2021-07-03 NOTE — Progress Notes (Signed)
? ?[facility] ?[address] ?[place], [place] [address] ? ? ?Patient Care Team: ?Caryl Bis, MD as PCP - General (Family Medicine) ?Arnoldo Lenis, MD as PCP - Cardiology (Cardiology) ? ?SUMMARY OF ONCOLOGIC HISTORY: ?Oncology History  ? No history exists.  ? ? ?CHIEF COMPLIANT: Follow-up for left breast LCIS ? ? ?INTERVAL HISTORY: [name] is a 46 y.o. female here today for follow up of her left breast LCIS. Her last visit was on 12/27/2020.  ? ?Today she reports feeling good. She is taking tamoxifen and tolerating it well. She started on a keto diet for the past 6 months. She denies hair thinning and vaginal bleeding or spotting. She reports occasional clear vaginal discharge occurring about once a month. She has lost 3 lbs since her last visit. She denies aches and pains. She reports occasional numbness in her forearms and hands in the morning which goes away with movement. She continues to take vitamin D. She does not currently take calcium. She reports continued occasional palpitations while sitting.  ? ?REVIEW OF SYSTEMS:   ?Review of Systems  ?Constitutional:  Negative for appetite change, fatigue and unexpected weight change.  ?Cardiovascular:  Positive for palpitations.  ?Genitourinary:  Positive for vaginal discharge. Negative for vaginal bleeding.   ?Musculoskeletal:  Negative for arthralgias.  ?All other systems reviewed and are negative. ? ?I have reviewed the past medical history, past surgical history, social history and family history with the patient and they are unchanged from previous note. ? ? ?ALLERGIES:   ?has No Known Allergies. ? ? ?MEDICATIONS:  ?Current Outpatient Medications  ?Medication Sig Dispense Refill  ? amphetamine-dextroamphetamine (ADDERALL) 30 MG tablet Take 30 mg by mouth 3 (three) times daily.    ? amphetamine-dextroamphetamine (ADDERALL) 30 MG tablet Take 1 tablet by mouth 2 (two) times daily. 180 tablet 0  ? amphetamine-dextroamphetamine  (ADDERALL) 30 MG tablet Take 1 tablet by mouth every evening. 90 tablet 0  ? amphetamine-dextroamphetamine (ADDERALL) 30 MG tablet Take 1 tablet by mouth 2 (two) times daily. 180 tablet 0  ? amphetamine-dextroamphetamine (ADDERALL) 30 MG tablet Take 1 tablet by mouth every evening. 90 tablet 0  ? ergocalciferol (VITAMIN D2) 1.25 MG (50000 UT) capsule Take 1 capsule (50,000 Units total) by mouth once a week. 12 capsule 2  ? ibuprofen (ADVIL) 800 MG tablet TAKE 1 TABLET BY MOUTH EVERY 8 HOURS AFTER A MEAL 30 tablet 0  ? losartan (COZAAR) 50 MG tablet Take 1 tablet (50 mg total) by mouth daily. 90 tablet 0  ? losartan (COZAAR) 50 MG tablet Take 1 tablet (50 mg total) by mouth daily. 90 tablet 0  ? tamoxifen (NOLVADEX) 20 MG tablet Take 1 tablet (20 mg total) by mouth daily. 30 tablet 6  ? venlafaxine XR (EFFEXOR-XR) 37.5 MG 24 hr capsule Take 1 capsule by mouth once day 30 capsule 0  ? ?No current facility-administered medications for this visit.  ? ? ? ?PHYSICAL EXAMINATION: ?Performance status (ECOG): 0 - Asymptomatic ? ?Vitals:  ? 07/03/21 1422  ?BP: 135/90  ?Pulse: (!) 103  ?Resp: 19  ?Temp: 99.1 ?F (37.3 ?C)  ?SpO2: 100%  ? ?Wt Readings from Last 3 Encounters:  ?07/03/21 159 lb (72.1 kg)  ?02/07/21 161 lb 12.8 oz (73.4 kg)  ?12/27/20 162 lb 12.8 oz (73.8 kg)  ? ?Physical Exam ?Vitals reviewed.  ?Constitutional:   ?   Appearance: Normal appearance.  ?Cardiovascular:  ?   Rate and Rhythm: Normal rate  and regular rhythm.  ?   Pulses: Normal pulses.  ?   Heart sounds: Normal heart sounds.  ?Pulmonary:  ?   Effort: Pulmonary effort is normal.  ?   Breath sounds: Normal breath sounds.  ?Neurological:  ?   General: No focal deficit present.  ?   Mental Status: She is alert and oriented to person, place, and time.  ?Psychiatric:     ?   Mood and Affect: Mood normal.     ?   Behavior: Behavior normal.  ? ? ?Breast Exam Chaperone: Thana Ates   ? ? ?LABORATORY DATA:  ?I have reviewed the data as listed ? ?  Latest Ref Rng  & Units 06/26/2021  ?  9:19 AM 12/20/2020  ?  9:29 AM 09/07/2020  ?  9:03 AM  ?CMP  ?Glucose 70 - 99 mg/dL 92   85   89    ?BUN 6 - 20 mg/dL 14   8   12     ?Creatinine 0.44 - 1.00 mg/dL 0.70   0.65   0.74    ?Sodium 135 - 145 mmol/L 138   138   135    ?Potassium 3.5 - 5.1 mmol/L 4.1   4.5   3.8    ?Chloride 98 - 111 mmol/L 104   106   107    ?CO2 22 - 32 mmol/L 24   22   21     ?Calcium 8.9 - 10.3 mg/dL 9.4   8.8   9.2    ?Total Protein 6.5 - 8.1 g/dL 7.7   6.7   7.2    ?Total Bilirubin 0.3 - 1.2 mg/dL 0.4   0.4   0.7    ?Alkaline Phos 38 - 126 U/L 49   42   37    ?AST 15 - 41 U/L 19   16   16     ?ALT 0 - 44 U/L 15   14   16     ? ?No results found for: MCE022 ?Lab Results  ?Component Value Date  ? WBC 3.8 (L) 06/26/2021  ? HGB 12.5 06/26/2021  ? HCT 37.6 06/26/2021  ? MCV 94.2 06/26/2021  ? PLT 434 (H) 06/26/2021  ? NEUTROABS 1.7 06/26/2021  ? ? ?ASSESSMENT:  ?1.  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 ?-Left breast biopsy, 1 o'clock position on 04/07/2020-LCIS involving a fibroadenoma. ?-MRI of the breast on 05/11/2020 with 6 mm enhancing mass in the posterior upper outer left breast.  No evidence of malignant cells within the breast.  No axillary lymph nodes. ?-Left lumpectomy on 05/23/2020 with 0.6 cm LCIS involving a fibroadenoma, LCIS focally less than 0.1 cm from lateral margin.  ER 70% positive, PR 90% positive, HER-2 negative.  No invasive component present. ?- Tamoxifen started on 06/14/2020. ?  ?2.  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 ?-She does not know father's side of the family.  No malignancies on her mother side. ? ? ?PLAN:  ?1.  LCIS of the upper outer quadrant of left breast: ?- She is tolerating tamoxifen reasonably well. ?- We  reviewed mammogram from 04/04/2021, BI-RADS Category 2. ?- She has lost about 2 pounds since last visit.  She is doing keto diet. ?-  She has occasional clear vaginal discharge once a month.  She continues to have occasional palpitations and was worked up by cardiology. ?- We reviewed her labs which showed slightly elevated platelet count.  Mild leukopenia stable.  LFTs are normal. ?- Continue tamoxifen.  RTC 6 months for follow-up with repeat labs. ?- Recommend follow-up with GYN for Pap smear yearly. ? ?2.  Vitamin D deficiency: ?- She is taking vitamin D 50,000 units weekly.  Vitamin D level is 58. ?- We will discontinue prescription vitamin D.-She was told to start taking vitamin D 5000 units daily. ? ?Breast Cancer therapy associated bone loss: I have recommended calcium, Vitamin D and weight bearing exercises. ? ?Orders placed this encounter:  ?No orders of the defined types were placed in this encounter. ? ? ?The patient has a good understanding of the overall plan. She agrees with it. She will call with any problems that may develop before the next visit here. ? ?Derek Jack, MD ?[place] ?[phone] ? ? ?I, Thana Ates, am acting as a scribe for Dr. Derek Jack. ? ?I, Derek Jack MD, have reviewed the above documentation for accuracy and completeness, and I agree with the above. ?  ? ? ?

## 2021-07-03 NOTE — Patient Instructions (Addendum)
[place] at [facility] ?Discharge Instructions ? ?You were seen and examined today by Dr. Delton Coombes. He reviewed your most recent labs and mammogram and everything looks good. Continue having yearly PAP smears and mammograms. Continue taking tamoxifen as prescribed. Cut back on Vitamin D and start taking over the counter Vitamin D 5000 units once daily and over the counter Calcium 500-600 mg once daily. Please keep follow up appointments as scheduled in 6 months. ? ? ?Thank you for choosing [place] at [facility] to provide your oncology and hematology care.  To afford each patient quality time with our provider, please arrive at least 15 minutes before your scheduled appointment time.  ? ?If you have a lab appointment with the [facility] please come in thru the Main Entrance and check in at the main information desk. ? ?You need to re-schedule your appointment should you arrive 10 or more minutes late.  We strive to give you quality time with our providers, and arriving late affects you and other patients whose appointments are after yours.  Also, if you no show three or more times for appointments you may be dismissed from the clinic at the providers discretion.     ?Again, thank you for choosing [facility].  Our hope is that these requests will decrease the amount of time that you wait before being seen by our physicians.       ?_____________________________________________________________ ? ?Should you have questions after your visit to [facility], please contact our office at [phone] and follow the prompts.  Our office hours are 8:00 a.m. and 4:30 p.m. Monday - Friday.  Please note that voicemails left after 4:00 p.m. may not be returned until the following business day.  We are closed weekends and major holidays.  You do have access to a nurse 24-7, just call the main number to the clinic [phone] and do not press  any options, hold on the line and a nurse will answer the phone.   ? ?For prescription refill requests, have your pharmacy contact our office and allow 72 hours.   ? ?Due to Covid, you will need to wear a mask upon entering the hospital. If you do not have a mask, a mask will be given to you at the Main Entrance upon arrival. For doctor visits, patients may have 1 support person age 48 or older with them. For treatment visits, patients can not have anyone with them due to social distancing guidelines and our immunocompromised population.  ? ?  ?

## 2021-07-18 ENCOUNTER — Ambulatory Visit: Payer: No Typology Code available for payment source | Admitting: Nurse Practitioner

## 2021-07-28 ENCOUNTER — Other Ambulatory Visit (HOSPITAL_COMMUNITY): Payer: Self-pay

## 2021-08-01 ENCOUNTER — Encounter: Payer: Self-pay | Admitting: Nurse Practitioner

## 2021-08-01 ENCOUNTER — Ambulatory Visit (INDEPENDENT_AMBULATORY_CARE_PROVIDER_SITE_OTHER): Payer: No Typology Code available for payment source | Admitting: Nurse Practitioner

## 2021-08-01 DIAGNOSIS — F909 Attention-deficit hyperactivity disorder, unspecified type: Secondary | ICD-10-CM

## 2021-08-01 NOTE — Progress Notes (Signed)
? ?New Patient Office Visit ? ?Subjective   ? ?Patient ID: Erika Galloway, female    DOB: Dec 07, 1975  Age: 46 y.o. MRN: [id number] ? ?CC:  ?Chief Complaint  ?Patient presents with  ? New Patient (Initial Visit)  ? ? ?HPI ?Erika Galloway presents to establish care ?Previous PCP was Scientist, research (physical sciences) at [facility] , last vist was about 3 months ago.  ? ?Oncology  ? ?Last PAP was  last year ? ? ?ADHD. Has been on adderall  '90mg'$  daily for years. I discussed with the patient about the need to gradually cut down her dosage to the recommended dose according to guideline but pt prefers to go back to her present PCP at [place] and not establish care with [place] today  ? ? ? ?Outpatient Encounter Medications as of 08/01/2021  ?Medication Sig  ? amphetamine-dextroamphetamine (ADDERALL) 30 MG tablet Take 1 tablet by mouth 2 (two) times daily.  ? amphetamine-dextroamphetamine (ADDERALL) 30 MG tablet Take 1 tablet by mouth every evening.  ? losartan (COZAAR) 50 MG tablet Take 1 tablet (50 mg total) by mouth daily.  ? tamoxifen (NOLVADEX) 20 MG tablet Take 1 tablet (20 mg total) by mouth daily.  ? [DISCONTINUED] losartan (COZAAR) 50 MG tablet Take 1 tablet (50 mg total) by mouth daily.  ? ergocalciferol (VITAMIN D2) 1.25 MG (50000 UT) capsule Take 1 capsule (50,000 Units total) by mouth once a week. (Patient not taking: Reported on 08/01/2021)  ? [DISCONTINUED] amphetamine-dextroamphetamine (ADDERALL) 30 MG tablet Take 30 mg by mouth 3 (three) times daily.  ? [DISCONTINUED] amphetamine-dextroamphetamine (ADDERALL) 30 MG tablet Take 1 tablet by mouth 2 (two) times daily.  ? [DISCONTINUED] amphetamine-dextroamphetamine (ADDERALL) 30 MG tablet Take 1 tablet by mouth every evening.  ? [DISCONTINUED] citalopram (CELEXA) 20 MG tablet Take 1 tablet (20 mg total) by mouth daily.  ? [DISCONTINUED] DULoxetine (CYMBALTA) 30 MG capsule Take 1 capsule (30 mg total) by mouth daily.  ? [DISCONTINUED] sertraline (ZOLOFT) 25 MG tablet Take 1 Tablet by mouth every  day  ? [DISCONTINUED] venlafaxine XR (EFFEXOR-XR) 37.5 MG 24 hr capsule Take 1 capsule by mouth once day  ? ?No facility-administered encounter medications on file as of 08/01/2021.  ? ? ?Past Medical History:  ?Diagnosis Date  ? Abnormal Pap smear of cervix   ? Depression   ? History of COVID-19 04/19/2020  ? ? ?Past Surgical History:  ?Procedure Laterality Date  ? ADENOIDECTOMY    ? BREAST LUMPECTOMY WITH RADIOACTIVE SEED LOCALIZATION Left 05/23/2020  ? Procedure: RADIOACTIVE SEED GUIDED LEFT BREAST LUMPECTOMY;  Surgeon: Jovita Kussmaul, MD;  Location: [facility];  Service: General;  Laterality: Left;  ? ? ?Family History  ?Problem Relation Age of Onset  ? Supraventricular tachycardia Mother   ? ? ?Social History  ? ?Socioeconomic History  ? Marital status: Married  ?  Spouse name: Not on file  ? Number of children: Not on file  ? Years of education: Not on file  ? Highest education level: Not on file  ?Occupational History  ? Not on file  ?Tobacco Use  ? Smoking status: Never  ? Smokeless tobacco: Never  ?Vaping Use  ? Vaping Use: Never used  ?Substance and Sexual Activity  ? Alcohol use: Never  ? Drug use: Never  ? Sexual activity: Not on file  ?Other Topics Concern  ? Not on file  ?Social History Narrative  ? Not on file  ? ?Social Determinants of Health  ? ?Financial Resource Strain:  Not on file  ?Food Insecurity: Not on file  ?Transportation Needs: Not on file  ?Physical Activity: Not on file  ?Stress: Not on file  ?Social Connections: Not on file  ?Intimate Partner Violence: Not on file  ? ? ?ROS ? ?  ? ? ?Objective   ? ?BP 127/86 (BP Location: Left Arm, Patient Position: Sitting, Cuff Size: Large)   Pulse 75   Ht '5\' 5"'$  (1.651 m)   Wt 159 lb (72.1 kg)   LMP 07/17/2021 (Approximate)   SpO2 100%   BMI 26.46 kg/m?  ? ?Physical Exam ? ? ?  ? ?Assessment & Plan:  ? ?Problem List Items Addressed This Visit   ?None ? ? ?No follow-ups on file.  ? ?Renee Rival, FNP ? ?ADHD ?as been on  adderall  '90mg'$  daily for years. I discussed with the patient about the need to gradually cut down her dosage to the recommended dose according to guideline but pt prefers to go back to her present PCP at [place] and not establish care with [place] today  ?  ?

## 2021-08-01 NOTE — Patient Instructions (Signed)
Has been on adderall  '90mg'$  daily for years. I discussed with the patient about the need to gradually cut down her dosage to the recommended dose according to guideline but pt prefers to go back to her present PCP at [place] and not establish care with [place] today  ? ?

## 2021-08-01 NOTE — Assessment & Plan Note (Signed)
as been on adderall  '90mg'$  daily for years. I discussed with the patient about the need to gradually cut down her dosage to the recommended dose according to guideline but pt prefers to go back to her present PCP at [place] and not establish care with [place] today  ? ?

## 2021-08-15 ENCOUNTER — Encounter (HOSPITAL_COMMUNITY): Payer: Self-pay

## 2021-09-01 ENCOUNTER — Other Ambulatory Visit (HOSPITAL_COMMUNITY): Payer: Self-pay

## 2021-09-01 MED ORDER — AMPHETAMINE-DEXTROAMPHETAMINE 30 MG PO TABS: 30.0000 mg | ORAL_TABLET | Freq: Two times a day (BID) | ORAL | 0 refills | Status: DC

## 2021-09-01 MED ORDER — AMPHETAMINE-DEXTROAMPHETAMINE 30 MG PO TABS: 30.0000 mg | ORAL_TABLET | Freq: Every day | ORAL | 0 refills | Status: DC

## 2021-09-05 ENCOUNTER — Other Ambulatory Visit (HOSPITAL_COMMUNITY): Payer: Self-pay

## 2021-09-05 MED ORDER — AMPHETAMINE-DEXTROAMPHETAMINE 30 MG PO TABS
30.0000 mg | ORAL_TABLET | Freq: Two times a day (BID) | ORAL | 0 refills | Status: DC
  Filled 2021-09-05: qty 180, 90d supply, fill #0

## 2021-09-05 MED ORDER — AMPHETAMINE-DEXTROAMPHETAMINE 30 MG PO TABS
30.0000 mg | ORAL_TABLET | Freq: Every day | ORAL | 0 refills | Status: DC
  Filled 2021-09-06: qty 90, 90d supply, fill #0

## 2021-09-06 ENCOUNTER — Other Ambulatory Visit (HOSPITAL_COMMUNITY): Payer: Self-pay

## 2021-09-28 ENCOUNTER — Other Ambulatory Visit (HOSPITAL_COMMUNITY): Payer: Self-pay

## 2021-11-27 ENCOUNTER — Other Ambulatory Visit (HOSPITAL_COMMUNITY): Payer: Self-pay

## 2021-11-27 MED ORDER — SUMATRIPTAN SUCCINATE 100 MG PO TABS
100.0000 mg | ORAL_TABLET | ORAL | 1 refills | Status: DC | PRN
  Filled 2021-11-27: qty 9, 30d supply, fill #0

## 2021-12-19 ENCOUNTER — Other Ambulatory Visit (HOSPITAL_COMMUNITY): Payer: Self-pay

## 2021-12-27 ENCOUNTER — Other Ambulatory Visit (HOSPITAL_COMMUNITY): Payer: Self-pay

## 2021-12-27 MED ORDER — AMPHETAMINE-DEXTROAMPHETAMINE 30 MG PO TABS
30.0000 mg | ORAL_TABLET | Freq: Two times a day (BID) | ORAL | 0 refills | Status: DC
  Filled 2021-12-27: qty 180, 90d supply, fill #0

## 2021-12-28 ENCOUNTER — Other Ambulatory Visit (HOSPITAL_COMMUNITY): Payer: Self-pay

## 2022-01-02 ENCOUNTER — Other Ambulatory Visit (HOSPITAL_COMMUNITY): Payer: Self-pay

## 2022-01-02 MED ORDER — AMPHETAMINE-DEXTROAMPHETAMINE 30 MG PO TABS
30.0000 mg | ORAL_TABLET | Freq: Two times a day (BID) | ORAL | 0 refills | Status: DC
  Filled 2022-01-02 – 2022-03-09 (×2): qty 90, 45d supply, fill #0

## 2022-01-22 ENCOUNTER — Inpatient Hospital Stay: Payer: No Typology Code available for payment source

## 2022-01-23 ENCOUNTER — Other Ambulatory Visit (HOSPITAL_COMMUNITY): Payer: Self-pay

## 2022-01-23 ENCOUNTER — Inpatient Hospital Stay: Payer: No Typology Code available for payment source | Attending: Hematology

## 2022-01-23 DIAGNOSIS — D0502 Lobular carcinoma in situ of left breast: Secondary | ICD-10-CM

## 2022-01-23 DIAGNOSIS — Z79810 Long term (current) use of selective estrogen receptor modulators (SERMs): Secondary | ICD-10-CM | POA: Insufficient documentation

## 2022-01-23 DIAGNOSIS — E559 Vitamin D deficiency, unspecified: Secondary | ICD-10-CM

## 2022-01-23 DIAGNOSIS — D0512 Intraductal carcinoma in situ of left breast: Secondary | ICD-10-CM | POA: Insufficient documentation

## 2022-01-23 LAB — COMPREHENSIVE METABOLIC PANEL
ALT: 19 U/L (ref 0–44)
AST: 19 U/L (ref 15–41)
Albumin: 3.8 g/dL (ref 3.5–5.0)
Alkaline Phosphatase: 53 U/L (ref 38–126)
Anion gap: 7 (ref 5–15)
BUN: 11 mg/dL (ref 6–20)
CO2: 23 mmol/L (ref 22–32)
Calcium: 9.1 mg/dL (ref 8.9–10.3)
Chloride: 107 mmol/L (ref 98–111)
Creatinine, Ser: 0.84 mg/dL (ref 0.44–1.00)
GFR, Estimated: >60 mL/min (ref >60)
Glucose, Bld: 91 mg/dL (ref 70–99)
Potassium: 3.9 mmol/L (ref 3.5–5.1)
Sodium: 137 mmol/L (ref 135–145)
Total Bilirubin: 0.6 mg/dL (ref 0.3–1.2)
Total Protein: 6.9 g/dL (ref 6.5–8.1)

## 2022-01-23 LAB — CBC WITH DIFFERENTIAL/PLATELET
Abs Immature Granulocytes: 0 10*3/uL (ref 0.00–0.07)
Basophils Absolute: 0.1 10*3/uL (ref 0.0–0.1)
Basophils Relative: 1 %
Eosinophils Absolute: 0.1 10*3/uL (ref 0.0–0.5)
Eosinophils Relative: 2 %
HCT: 35.1 % — ABNORMAL LOW (ref 36.0–46.0)
Hemoglobin: 11.7 g/dL — ABNORMAL LOW (ref 12.0–15.0)
Immature Granulocytes: 0 %
Lymphocytes Relative: 37 %
Lymphs Abs: 1.6 10*3/uL (ref 0.7–4.0)
MCH: 30.1 pg (ref 26.0–34.0)
MCHC: 33.3 g/dL (ref 30.0–36.0)
MCV: 90.2 fL (ref 80.0–100.0)
Monocytes Absolute: 0.4 10*3/uL (ref 0.1–1.0)
Monocytes Relative: 10 %
Neutro Abs: 2.1 10*3/uL (ref 1.7–7.7)
Neutrophils Relative %: 50 %
Platelets: 343 10*3/uL (ref 150–400)
RBC: 3.89 MIL/uL (ref 3.87–5.11)
RDW: 13 % (ref 11.5–15.5)
WBC: 4.3 10*3/uL (ref 4.0–10.5)
nRBC: 0 % (ref 0.0–0.2)

## 2022-01-23 LAB — VITAMIN D 25 HYDROXY (VIT D DEFICIENCY, FRACTURES): Vit D, 25-Hydroxy: 14.41 ng/mL — ABNORMAL LOW (ref 30–100)

## 2022-01-24 ENCOUNTER — Other Ambulatory Visit (HOSPITAL_COMMUNITY): Payer: Self-pay

## 2022-01-29 ENCOUNTER — Ambulatory Visit (HOSPITAL_COMMUNITY): Payer: No Typology Code available for payment source | Admitting: Hematology

## 2022-02-01 ENCOUNTER — Encounter: Payer: Self-pay | Admitting: Hematology

## 2022-02-01 ENCOUNTER — Other Ambulatory Visit: Payer: Self-pay | Admitting: Hematology

## 2022-02-01 ENCOUNTER — Inpatient Hospital Stay (HOSPITAL_BASED_OUTPATIENT_CLINIC_OR_DEPARTMENT_OTHER): Payer: No Typology Code available for payment source | Admitting: Hematology

## 2022-02-01 DIAGNOSIS — D0502 Lobular carcinoma in situ of left breast: Secondary | ICD-10-CM | POA: Diagnosis not present

## 2022-02-01 DIAGNOSIS — D0512 Intraductal carcinoma in situ of left breast: Secondary | ICD-10-CM | POA: Diagnosis not present

## 2022-02-01 DIAGNOSIS — E559 Vitamin D deficiency, unspecified: Secondary | ICD-10-CM | POA: Diagnosis not present

## 2022-02-01 NOTE — Progress Notes (Signed)
[facility] [address], [place] [address]   Patient Care Team: Renee Rival, FNP as PCP - General (Nurse Practitioner) Arnoldo Lenis, MD as PCP - Cardiology (Cardiology)  SUMMARY OF ONCOLOGIC HISTORY: Oncology History   No history exists.    CHIEF COMPLIANT: Follow-up for left breast LCIS   INTERVAL HISTORY: [name] is a 46 y.o. female seen for follow-up of left breast DCIS.  She is tolerating tamoxifen very well.  Denies any vaginal discharge or spotting or bleeding.  She is not taking any vitamin D supplements.  REVIEW OF SYSTEMS:   Review of Systems  Constitutional:  Negative for appetite change, fatigue and unexpected weight change.  Genitourinary:  Negative for vaginal bleeding.   Musculoskeletal:  Negative for arthralgias.  All other systems reviewed and are negative.   I have reviewed the past medical history, past surgical history, social history and family history with the patient and they are unchanged from previous note.   ALLERGIES:   has No Known Allergies.   MEDICATIONS:  Current Outpatient Medications  Medication Sig Dispense Refill   amphetamine-dextroamphetamine (ADDERALL) 30 MG tablet Take 1 tablet by mouth daily in the afternoon 90 tablet 0   amphetamine-dextroamphetamine (ADDERALL) 30 MG tablet Take 1 tablet by mouth 2 (two) times daily. 90 tablet 0   ergocalciferol (VITAMIN D2) 1.25 MG (50000 UT) capsule Take 1 capsule (50,000 Units total) by mouth once a week. 12 capsule 2   losartan (COZAAR) 50 MG tablet Take 1 tablet (50 mg total) by mouth daily. 90 tablet 0   SUMAtriptan (IMITREX) 100 MG tablet Take 1 tablet (100 mg total) by mouth as needed. 9 tablet 1   tamoxifen (NOLVADEX) 20 MG tablet Take 1 tablet (20 mg total) by mouth daily. 30 tablet 6   No current facility-administered medications for this visit.     PHYSICAL EXAMINATION: Performance status (ECOG): 0 - Asymptomatic  There were no vitals  filed for this visit.  Wt Readings from Last 3 Encounters:  08/01/21 159 lb (72.1 kg)  07/03/21 159 lb (72.1 kg)  02/07/21 161 lb 12.8 oz (73.4 kg)   Physical Exam Vitals reviewed.  Constitutional:      Appearance: Normal appearance.  Cardiovascular:     Rate and Rhythm: Normal rate and regular rhythm.     Pulses: Normal pulses.     Heart sounds: Normal heart sounds.  Pulmonary:     Effort: Pulmonary effort is normal.     Breath sounds: Normal breath sounds.  Neurological:     General: No focal deficit present.     Mental Status: She is alert and oriented to person, place, and time.  Psychiatric:        Mood and Affect: Mood normal.        Behavior: Behavior normal.     Breast Exam Chaperone: Thana Ates     LABORATORY DATA:  I have reviewed the data as listed    Latest Ref Rng & Units 01/23/2022    1:54 PM 06/26/2021    9:19 AM 12/20/2020    9:29 AM  CMP  Glucose 70 - 99 mg/dL 91  92  85   BUN 6 - 20 mg/dL 11  14  8    Creatinine 0.44 - 1.00 mg/dL 0.84  0.70  0.65   Sodium 135 - 145 mmol/L 137  138  138   Potassium 3.5 - 5.1 mmol/L 3.9  4.1  4.5  Chloride 98 - 111 mmol/L 107  104  106   CO2 22 - 32 mmol/L 23  24  22    Calcium 8.9 - 10.3 mg/dL 9.1  9.4  8.8   Total Protein 6.5 - 8.1 g/dL 6.9  7.7  6.7   Total Bilirubin 0.3 - 1.2 mg/dL 0.6  0.4  0.4   Alkaline Phos 38 - 126 U/L 53  49  42   AST 15 - 41 U/L 19  19  16    ALT 0 - 44 U/L 19  15  14     No results found for: "ZOX096" Lab Results  Component Value Date   WBC 4.3 01/23/2022   HGB 11.7 (L) 01/23/2022   HCT 35.1 (L) 01/23/2022   MCV 90.2 01/23/2022   PLT 343 01/23/2022   NEUTROABS 2.1 01/23/2022    ASSESSMENT:  1.  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 -Left breast biopsy, 1  o'clock position on 04/07/2020-LCIS involving a fibroadenoma. -MRI of the breast on 05/11/2020 with 6 mm enhancing mass in the posterior upper outer left breast.  No evidence of malignant cells within the breast.  No axillary lymph nodes. -Left lumpectomy on 05/23/2020 with 0.6 cm LCIS involving a fibroadenoma, LCIS focally less than 0.1 cm from lateral margin.  ER 70% positive, PR 90% positive, HER-2 negative.  No invasive component present. - Tamoxifen started on 06/14/2020.   2.  Social/family history: -She works as a Tourist information centre manager at [employer]. 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  No malignancies on her mother side.   PLAN:  1.  LCIS of the upper outer quadrant of left breast: - Physical exam today was within normal limits with no palpable breast masses or adenopathy. - Mammogram on 04/04/2021 was BI-RADS Category 2. - Reviewed labs today which showed normal LFTs and CBC. - Continue tamoxifen daily.  RTC 6 months for follow-up.  We will schedule her for mammogram in December.  2.  Vitamin D deficiency: - At last visit we have discontinued vitamin D 50,000 units weekly as her level was 58.  She was told to take vitamin D 5000 units daily.  She has not started it yet. - Today vitamin D is 14.  She will start vitamin D 5000 units daily. - We will plan to recheck vitamin D next visit.  Breast Cancer therapy associated bone loss: I have recommended calcium, Vitamin D and weight bearing exercises.  Orders placed this encounter:  Orders Placed This Encounter  Procedures   MM DIAG BREAST TOMO BILATERAL   CBC with Differential/Platelet   Comprehensive metabolic panel   VITAMIN D 25 Hydroxy (Vit-D Deficiency, Fractures)     The patient has a good understanding of the overall plan. She agrees with it. She will call with any problems that may develop before the next visit here.  Derek Jack, MD [place] [phone]

## 2022-02-01 NOTE — Patient Instructions (Signed)
[place]  Discharge Instructions  You were seen and examined today by Dr. Delton Coombes.  Dr. Delton Coombes discussed your most recent lab work which revealed that everything looks good except your Vitamin D is low.   Start taking Vitamin D 5000 units once daily.  Follow-up as scheduled in 6 months.    Thank you for choosing [facility] to provide your oncology and hematology care.   To afford each patient quality time with our provider, please arrive at least 15 minutes before your scheduled appointment time. You may need to reschedule your appointment if you arrive late (10 or more minutes). Arriving late affects you and other patients whose appointments are after yours.  Also, if you miss three or more appointments without notifying the office, you may be dismissed from the clinic at the provider's discretion.    Again, thank you for choosing [facility].  Our hope is that these requests will decrease the amount of time that you wait before being seen by our physicians.   If you have a lab appointment with the [facility] please come in thru the Main Entrance and check in at the main information desk.           _____________________________________________________________  Should you have questions after your visit to [facility], please contact our office at [phone] and follow the prompts.  Our office hours are 8:00 a.m. to 4:30 p.m. Monday - Thursday and 8:00 a.m. to 2:30 p.m. Friday.  Please note that voicemails left after 4:00 p.m. may not be returned until the following business day.  We are closed weekends and all major holidays.  You do have access to a nurse 24-7, just call the main number to the clinic [phone] and do not press any options, hold on the line and a nurse will answer the phone.    For prescription refill requests, have your pharmacy contact our office and allow 72 hours.     Masks are optional in the cancer centers. If you would like for your care team to wear a mask while they are taking care of you, please let them know. You may have one support person who is at least 44 years old accompany you for your appointments.

## 2022-02-05 IMAGING — MG MM PLC BREAST LOC DEV 1ST LESION INC MAMMO GUIDE*L*
6 series · 6 of 6 positions shown · non-contrast
Comparison: Previous exam(s).

CLINICAL DATA: Patient with LEFT breast fibroadenoma containing
LCIS scheduled for surgery today requiring preoperative radioactive
seed localization.

EXAM:
MAMMOGRAPHIC GUIDED RADIOACTIVE SEED LOCALIZATION OF THE LEFT BREAST

[L LM (1 of 2)]
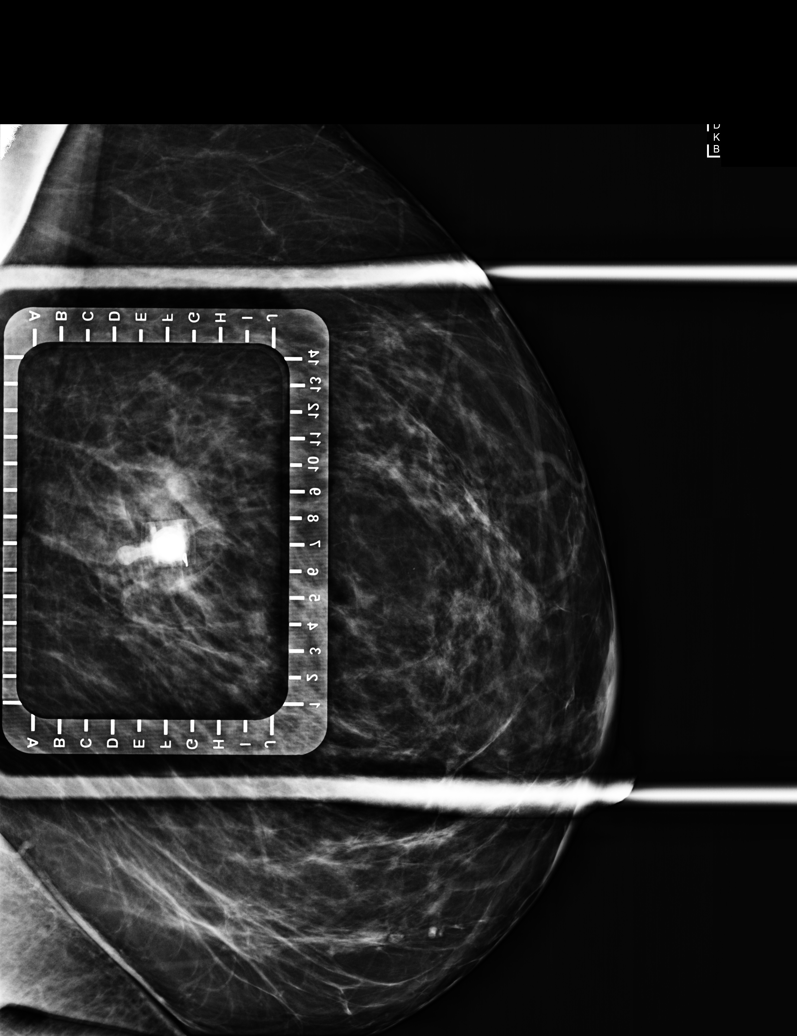

[L LM (2 of 2)]
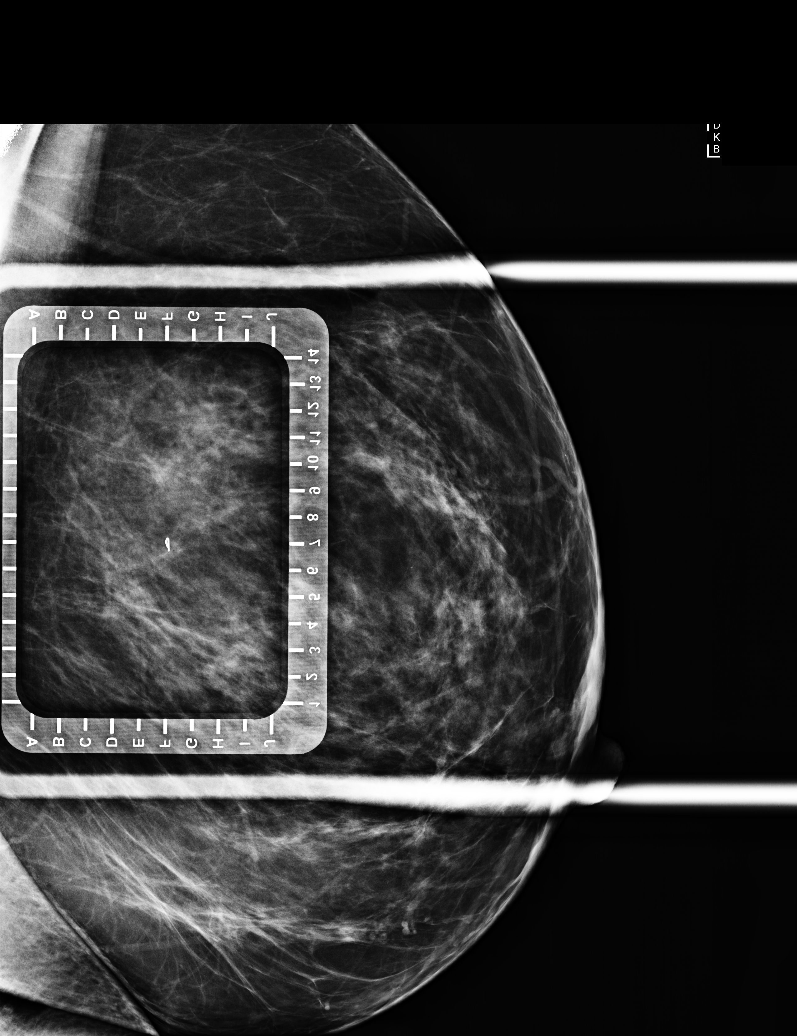

[L CC (1 of 3)]
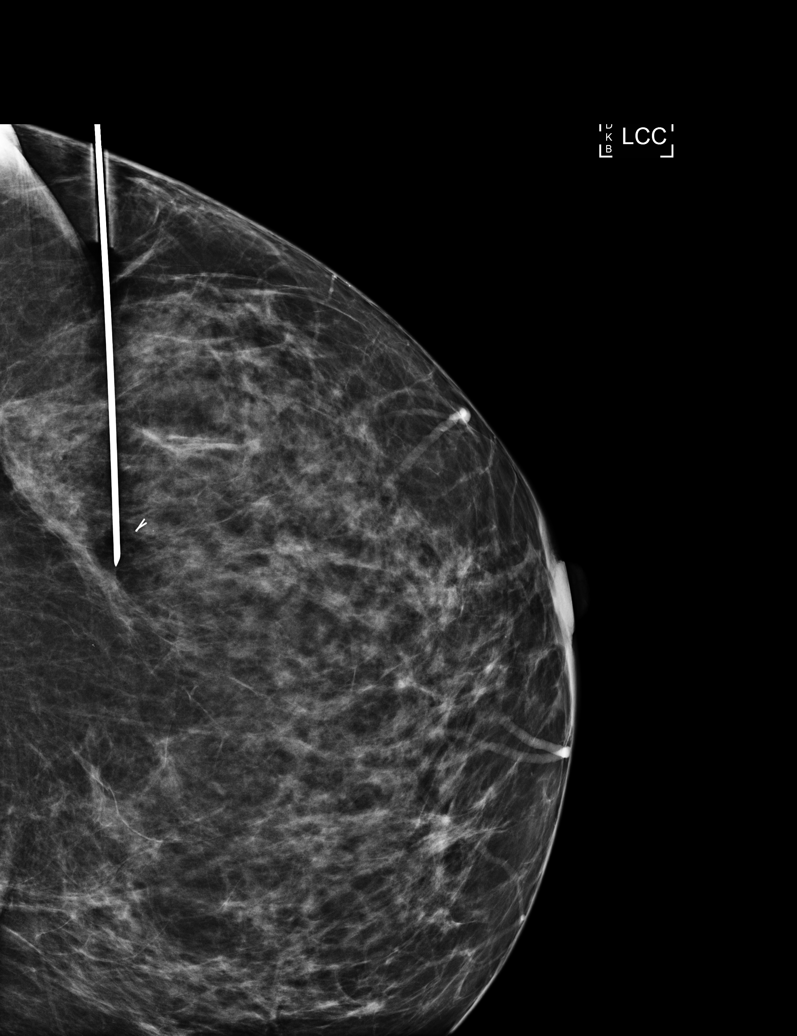

[L CC (2 of 3)]
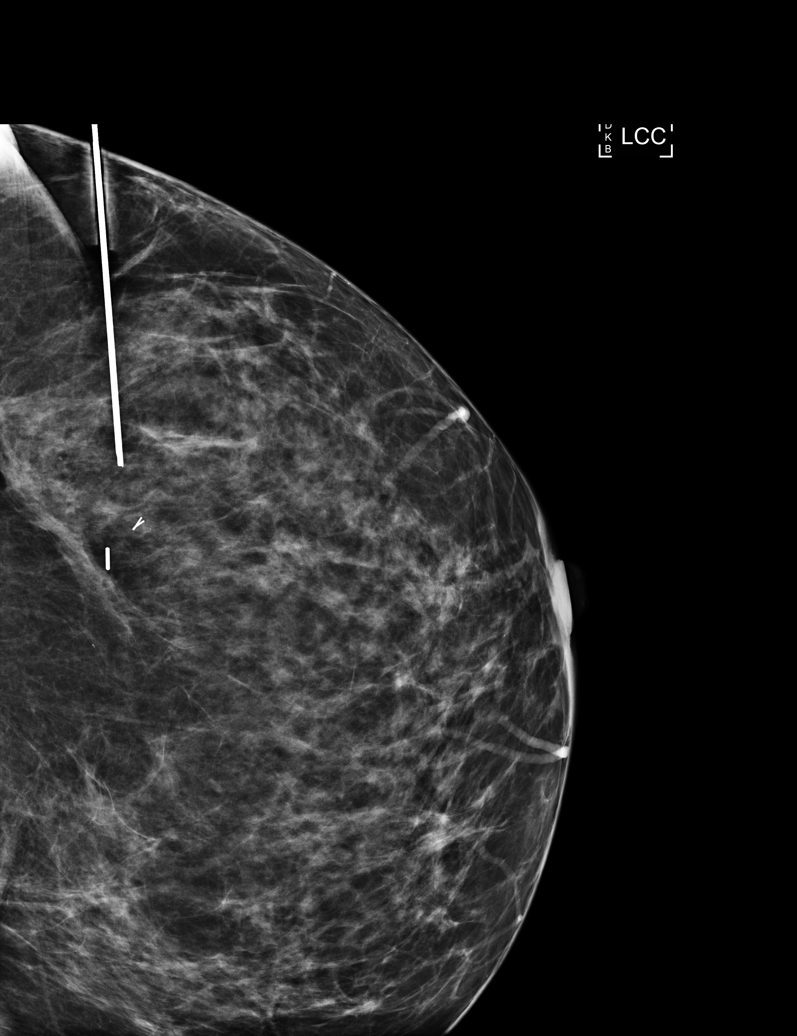

[L ML]
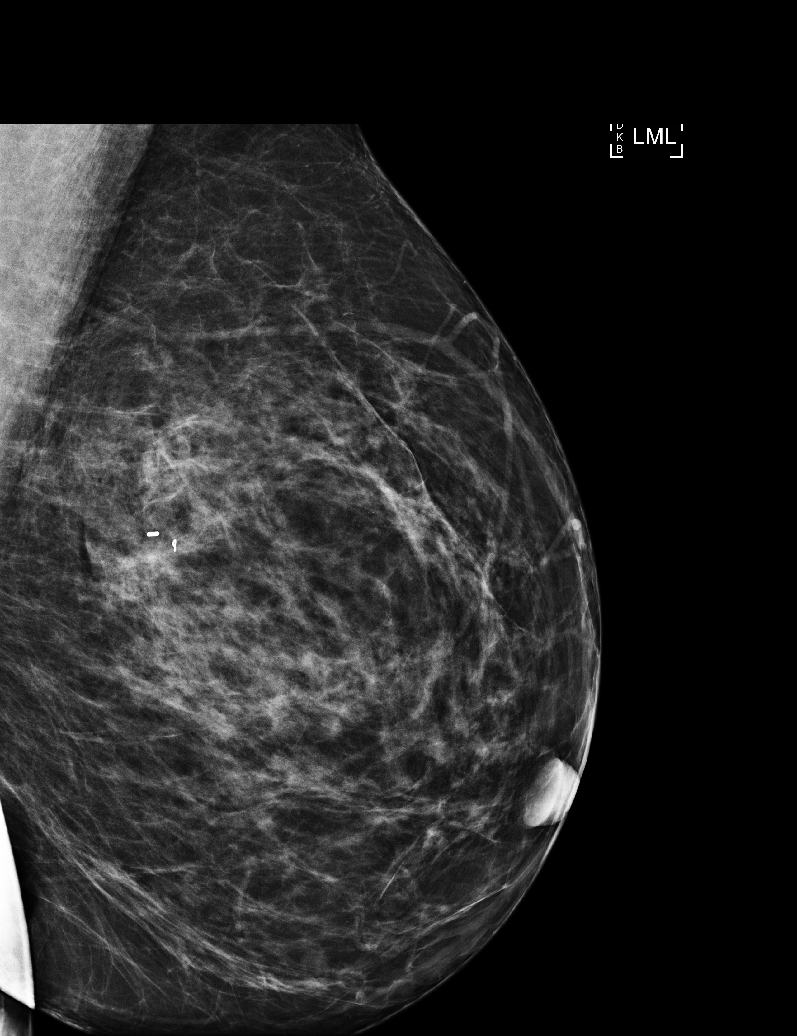

[L CC (3 of 3)]
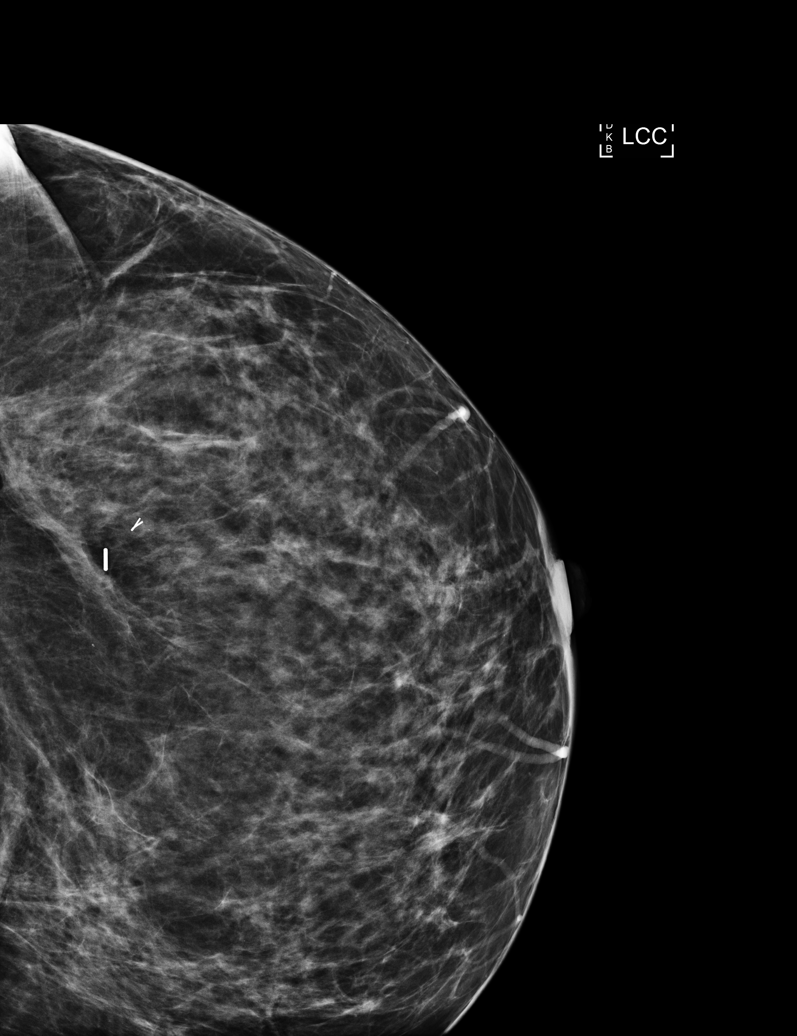

[6 of 6 positions shown; findings below may reference images not displayed]

FINDINGS: Patient presents for radioactive seed localization prior to surgical
excision. I met with the patient and we discussed the procedure of
seed localization including benefits and alternatives. We discussed
the high likelihood of a successful procedure. We discussed the
risks of the procedure including infection, bleeding, tissue injury
and further surgery. We discussed the low dose of radioactivity
involved in the procedure. Informed, written consent was given.

The usual time-out protocol was performed immediately prior to the
procedure.

Using mammographic guidance, sterile technique, 1% lidocaine and an
[id number] radioactive seed, the ribbon shaped clip within the outer LEFT
breast was localized using a lateral approach. The follow-up
mammogram images confirm the seed in the expected location and were
marked for Dr. Bulot.

Follow-up survey of the patient confirms presence of the radioactive
seed.

Order number of [id number] seed:  [id number].

Total activity:  0.260 millicuries reference Date: 02/17/2020

The patient tolerated the procedure well and was released from the
[REDACTED]. She was given instructions regarding seed removal.
IMPRESSION: Radioactive seed localization left breast. No apparent
complications.

The radioactive seed is positioned 5-6 mm posterior-medial to the
ribbon shaped biopsy clip. This was discussed with Dr. Bulot by phone
on 05/23/2020 at [DATE] a.m.

## 2022-02-05 IMAGING — MG MM BREAST SURGICAL SPECIMEN
1 series · 2 of 2 positions shown · non-contrast
Comparison: Previous exam(s).

CLINICAL DATA: Evaluate surgical specimen following excision of
LEFT breast fibroadenoma containing LCIS.

EXAM:
SPECIMEN RADIOGRAPH OF THE LEFT BREAST

[Series 2: L · left · 0.07mm/px · 2 of 2 slices shown]
[im 1/2]
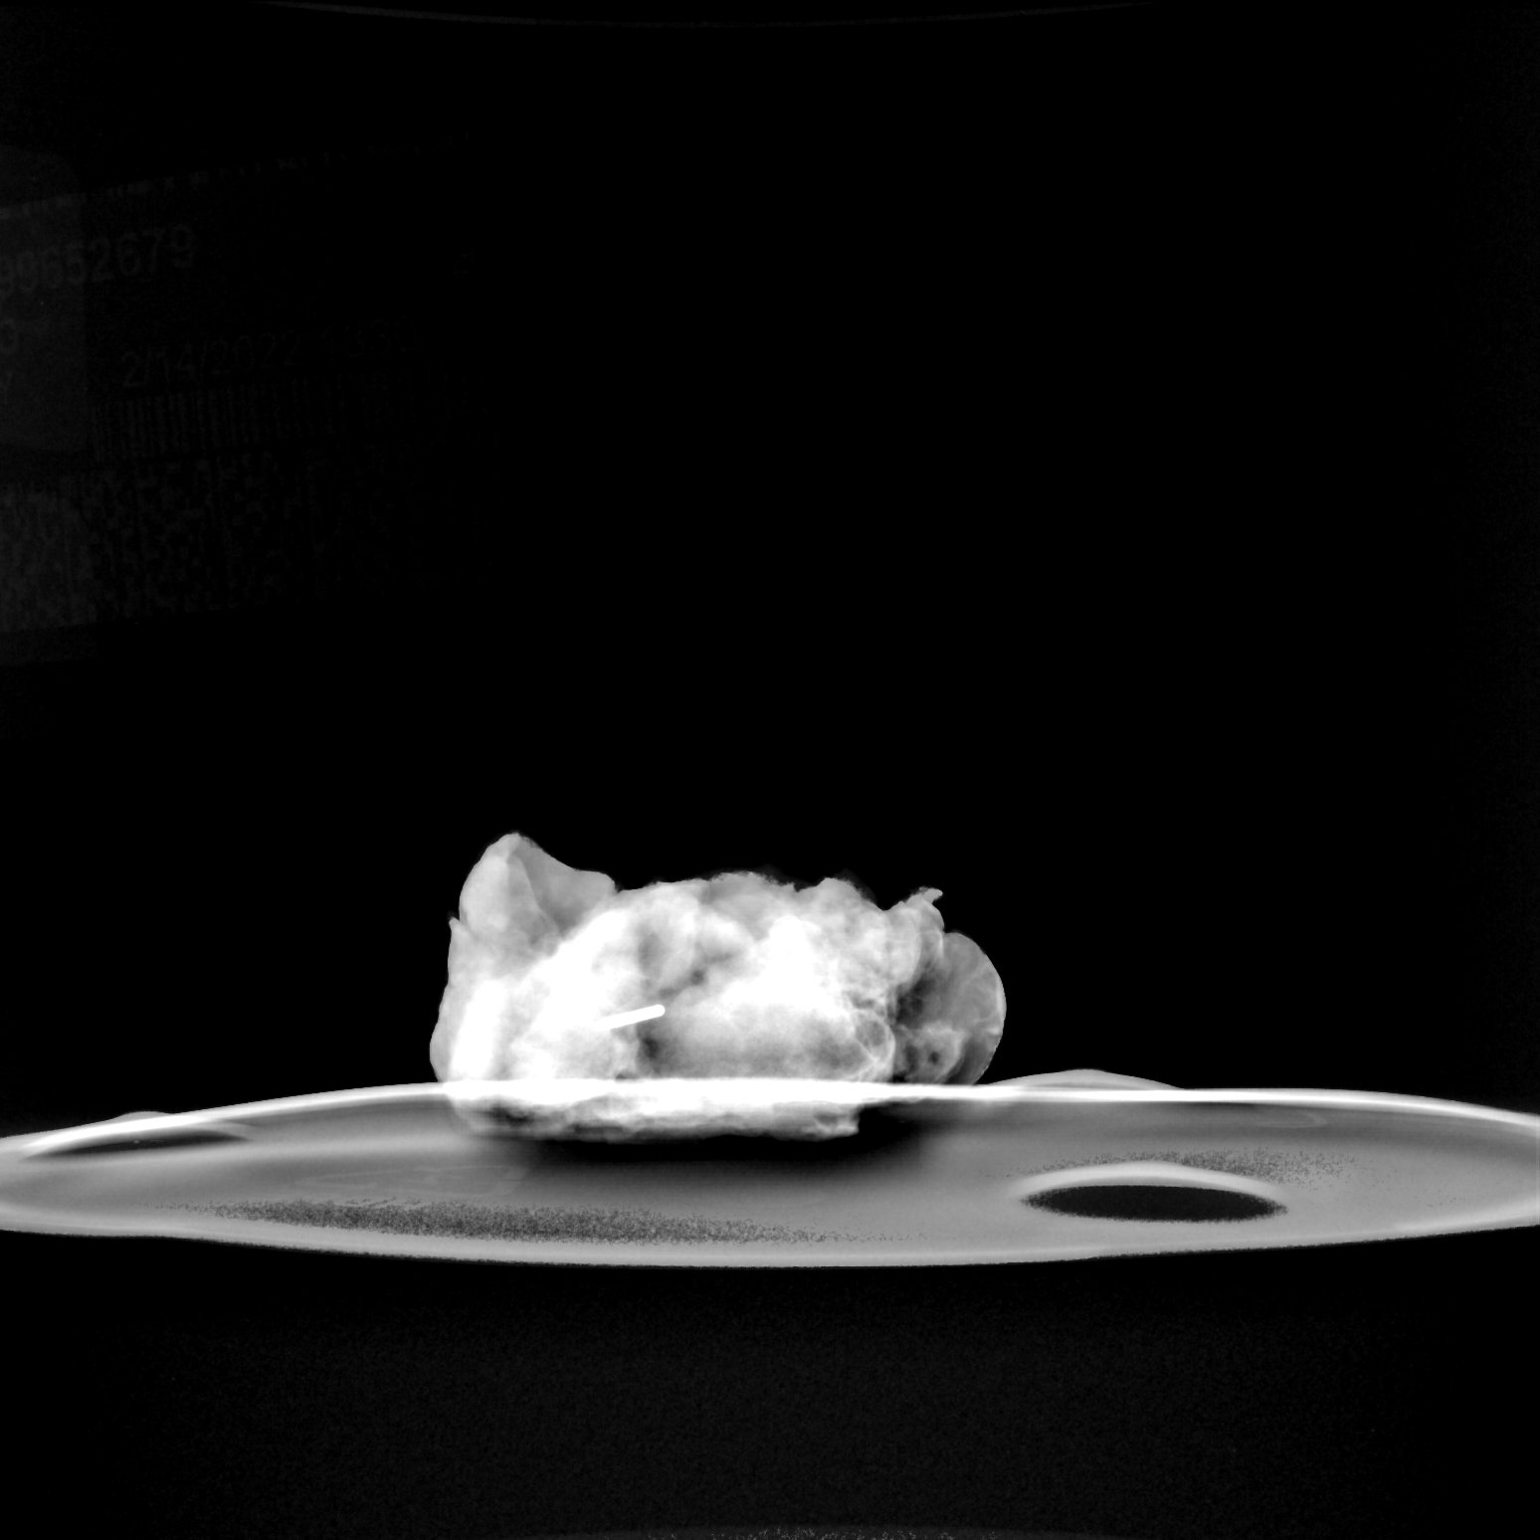
[im 2/2]
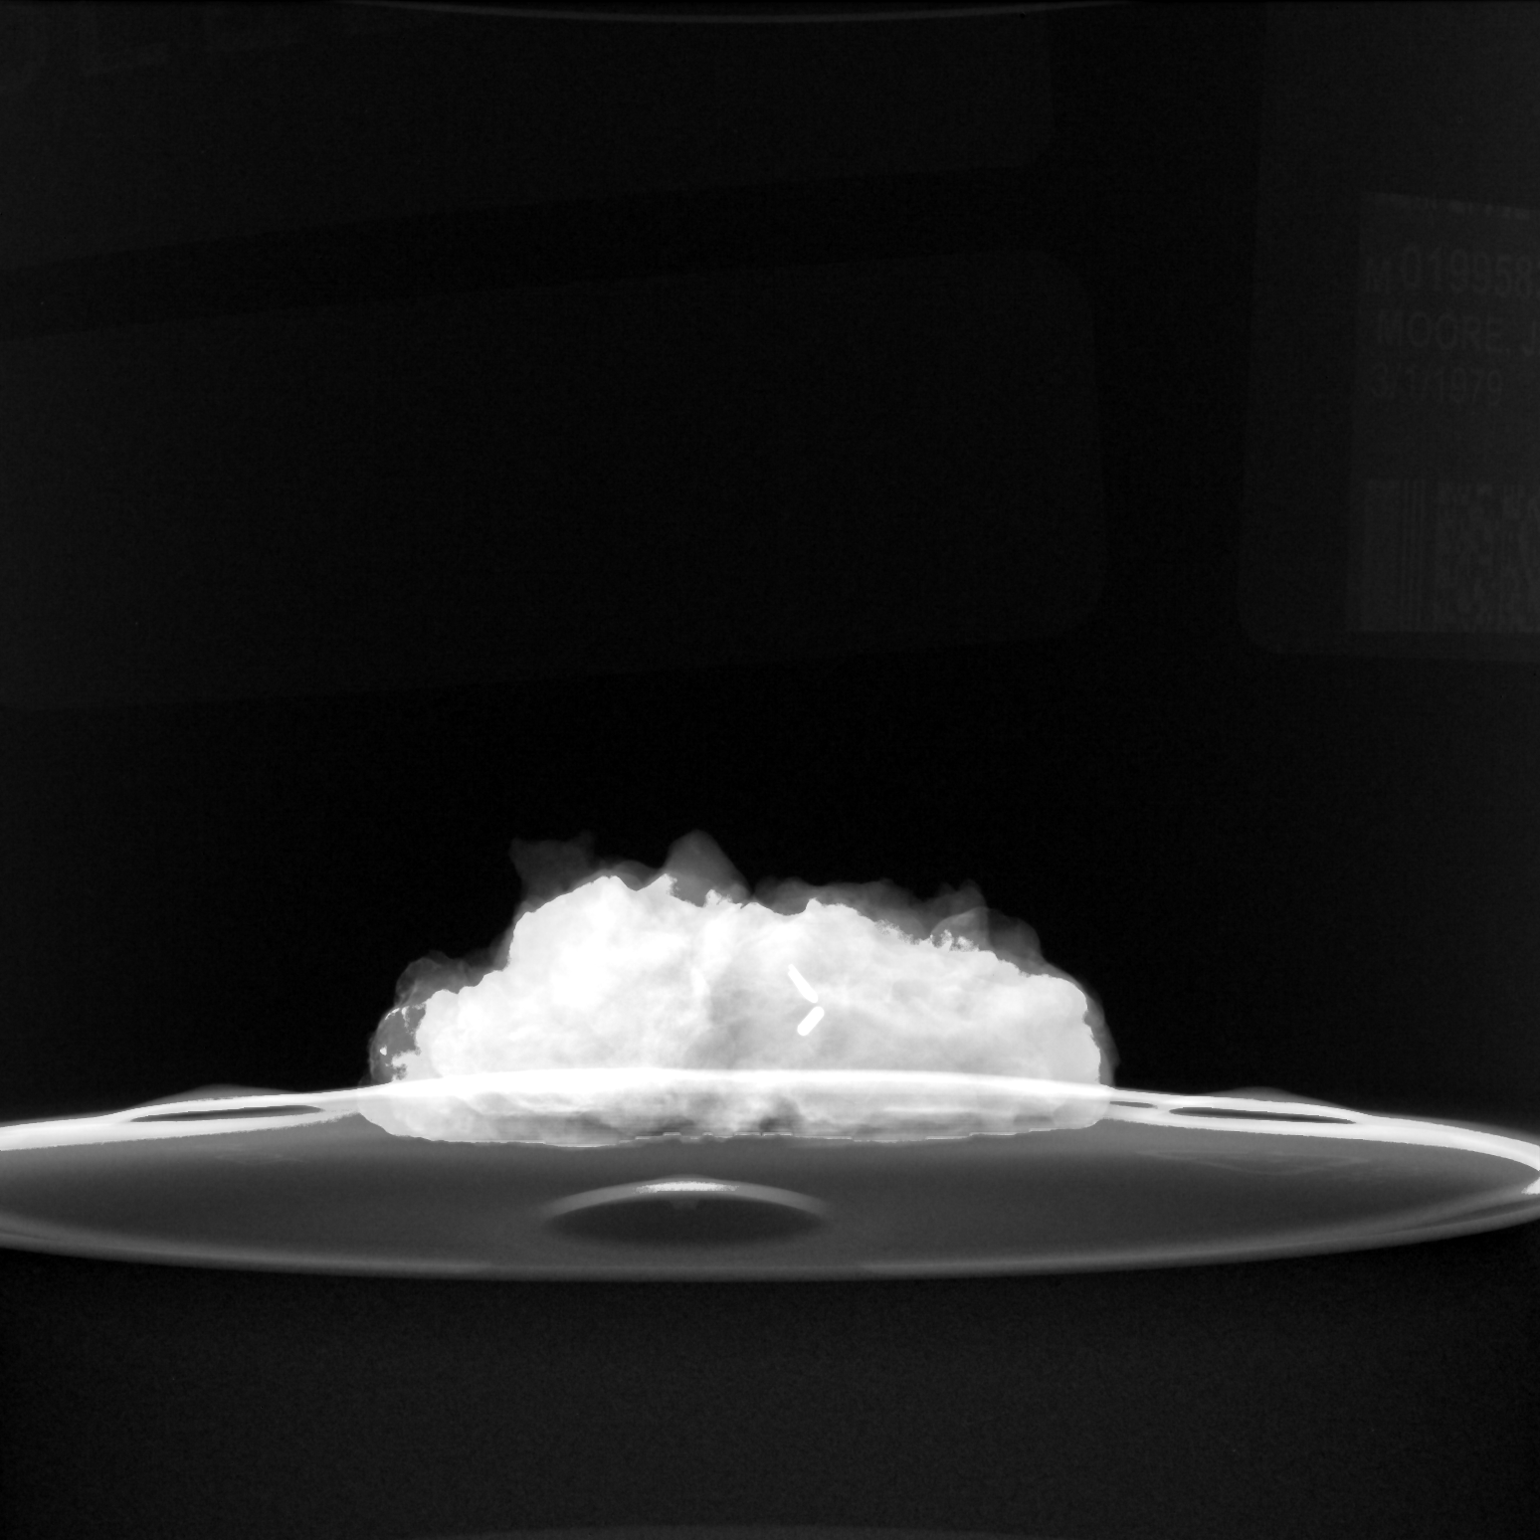

[2 of 2 positions shown; findings below may reference images not displayed]

FINDINGS: Status post excision of the LEFT breast. The radioactive seed and
biopsy marker clip are present and completely intact.
IMPRESSION: Specimen radiograph of the LEFT breast.

## 2022-03-09 ENCOUNTER — Other Ambulatory Visit (HOSPITAL_COMMUNITY): Payer: Self-pay

## 2022-03-09 MED ORDER — AMPHETAMINE-DEXTROAMPHETAMINE 30 MG PO TABS
30.0000 mg | ORAL_TABLET | Freq: Every day | ORAL | 0 refills | Status: DC
  Filled 2022-03-15: qty 90, 90d supply, fill #0

## 2022-03-09 MED ORDER — AMPHETAMINE-DEXTROAMPHETAMINE 30 MG PO TABS
30.0000 mg | ORAL_TABLET | Freq: Two times a day (BID) | ORAL | 0 refills | Status: DC
  Filled 2022-03-15: qty 180, 90d supply, fill #0

## 2022-03-12 ENCOUNTER — Other Ambulatory Visit (HOSPITAL_COMMUNITY): Payer: Self-pay

## 2022-03-13 ENCOUNTER — Other Ambulatory Visit (HOSPITAL_COMMUNITY): Payer: Self-pay

## 2022-03-14 ENCOUNTER — Other Ambulatory Visit (HOSPITAL_COMMUNITY): Payer: Self-pay

## 2022-03-15 ENCOUNTER — Other Ambulatory Visit (HOSPITAL_COMMUNITY): Payer: Self-pay

## 2022-03-15 ENCOUNTER — Other Ambulatory Visit: Payer: Self-pay

## 2022-03-15 MED ORDER — AMPHETAMINE-DEXTROAMPHETAMINE 30 MG PO TABS: 30.0000 mg | ORAL_TABLET | Freq: Two times a day (BID) | ORAL | 0 refills | Status: DC

## 2022-03-15 MED ORDER — AMPHETAMINE-DEXTROAMPHETAMINE 30 MG PO TABS
30.0000 mg | ORAL_TABLET | Freq: Two times a day (BID) | ORAL | 0 refills | Status: DC
  Filled 2022-03-15 (×3): qty 60, 30d supply, fill #0

## 2022-03-16 ENCOUNTER — Other Ambulatory Visit (HOSPITAL_COMMUNITY): Payer: Self-pay

## 2022-03-20 ENCOUNTER — Other Ambulatory Visit (HOSPITAL_COMMUNITY): Payer: Self-pay

## 2022-03-20 MED ORDER — SUMATRIPTAN SUCCINATE 100 MG PO TABS
100.0000 mg | ORAL_TABLET | ORAL | 0 refills | Status: DC | PRN
  Filled 2022-03-20: qty 9, 30d supply, fill #0

## 2022-03-30 ENCOUNTER — Other Ambulatory Visit (HOSPITAL_COMMUNITY): Payer: Self-pay

## 2022-03-30 MED ORDER — LOSARTAN POTASSIUM 50 MG PO TABS
50.0000 mg | ORAL_TABLET | Freq: Every day | ORAL | 0 refills | Status: DC
  Filled 2022-03-30: qty 90, 90d supply, fill #0

## 2022-04-06 ENCOUNTER — Other Ambulatory Visit (HOSPITAL_COMMUNITY): Payer: Self-pay

## 2022-04-07 MED ORDER — SUMATRIPTAN SUCCINATE 100 MG PO TABS
100.0000 mg | ORAL_TABLET | ORAL | 0 refills | Status: DC | PRN
  Filled 2022-04-07: qty 9, 30d supply, fill #0

## 2022-04-10 ENCOUNTER — Other Ambulatory Visit (HOSPITAL_COMMUNITY): Payer: Self-pay

## 2022-04-11 ENCOUNTER — Other Ambulatory Visit (HOSPITAL_COMMUNITY): Payer: Self-pay

## 2022-04-16 ENCOUNTER — Other Ambulatory Visit (HOSPITAL_COMMUNITY): Payer: Self-pay

## 2022-04-16 DIAGNOSIS — G43919 Migraine, unspecified, intractable, without status migrainosus: Secondary | ICD-10-CM | POA: Diagnosis not present

## 2022-04-16 DIAGNOSIS — F331 Major depressive disorder, recurrent, moderate: Secondary | ICD-10-CM | POA: Diagnosis not present

## 2022-04-16 DIAGNOSIS — F900 Attention-deficit hyperactivity disorder, predominantly inattentive type: Secondary | ICD-10-CM | POA: Diagnosis not present

## 2022-04-16 MED ORDER — AMPHETAMINE-DEXTROAMPHETAMINE 30 MG PO TABS
30.0000 mg | ORAL_TABLET | Freq: Every day | ORAL | 0 refills | Status: DC
  Filled 2022-04-16 (×2): qty 90, 90d supply, fill #0

## 2022-04-16 MED ORDER — AMPHETAMINE-DEXTROAMPHETAMINE 30 MG PO TABS
30.0000 mg | ORAL_TABLET | Freq: Two times a day (BID) | ORAL | 0 refills | Status: DC
  Filled 2022-04-16: qty 180, 60d supply, fill #0
  Filled 2022-04-16: qty 180, 90d supply, fill #0

## 2022-04-16 MED ORDER — PROPRANOLOL HCL ER 60 MG PO CP24
60.0000 mg | ORAL_CAPSULE | Freq: Every evening | ORAL | 1 refills | Status: DC
  Filled 2022-04-16: qty 30, 30d supply, fill #0

## 2022-04-17 ENCOUNTER — Ambulatory Visit (HOSPITAL_COMMUNITY)
Admission: RE | Admit: 2022-04-17 | Discharge: 2022-04-17 | Disposition: A | Discharge status: Home / Self Care | Payer: 59 | Source: Ambulatory Visit | Attending: Hematology | Admitting: Hematology

## 2022-04-17 ENCOUNTER — Other Ambulatory Visit (HOSPITAL_COMMUNITY): Payer: Self-pay

## 2022-04-17 ENCOUNTER — Encounter (HOSPITAL_COMMUNITY): Payer: Self-pay

## 2022-04-17 DIAGNOSIS — D0502 Lobular carcinoma in situ of left breast: Secondary | ICD-10-CM | POA: Insufficient documentation

## 2022-04-17 DIAGNOSIS — N6002 Solitary cyst of left breast: Secondary | ICD-10-CM | POA: Diagnosis not present

## 2022-04-17 DIAGNOSIS — R928 Other abnormal and inconclusive findings on diagnostic imaging of breast: Secondary | ICD-10-CM | POA: Diagnosis not present

## 2022-04-19 ENCOUNTER — Other Ambulatory Visit (HOSPITAL_COMMUNITY): Payer: Self-pay

## 2022-04-25 ENCOUNTER — Other Ambulatory Visit (HOSPITAL_COMMUNITY): Payer: Self-pay

## 2022-05-03 ENCOUNTER — Other Ambulatory Visit (HOSPITAL_COMMUNITY): Payer: Self-pay

## 2022-06-08 ENCOUNTER — Other Ambulatory Visit (HOSPITAL_COMMUNITY): Payer: Self-pay

## 2022-06-15 ENCOUNTER — Other Ambulatory Visit (HOSPITAL_COMMUNITY): Payer: Self-pay

## 2022-06-17 MED ORDER — SUMATRIPTAN SUCCINATE 100 MG PO TABS
100.0000 mg | ORAL_TABLET | ORAL | 1 refills | Status: DC | PRN
  Filled 2022-06-17 – 2022-06-18 (×3): qty 9, 30d supply, fill #0
  Filled 2022-10-19: qty 9, 30d supply, fill #1

## 2022-06-18 ENCOUNTER — Other Ambulatory Visit (HOSPITAL_COMMUNITY): Payer: Self-pay

## 2022-07-13 ENCOUNTER — Other Ambulatory Visit (HOSPITAL_COMMUNITY): Payer: Self-pay

## 2022-07-13 DIAGNOSIS — G43919 Migraine, unspecified, intractable, without status migrainosus: Secondary | ICD-10-CM | POA: Diagnosis not present

## 2022-07-13 DIAGNOSIS — F331 Major depressive disorder, recurrent, moderate: Secondary | ICD-10-CM | POA: Diagnosis not present

## 2022-07-13 DIAGNOSIS — F900 Attention-deficit hyperactivity disorder, predominantly inattentive type: Secondary | ICD-10-CM | POA: Diagnosis not present

## 2022-07-13 DIAGNOSIS — Z6825 Body mass index (BMI) 25.0-25.9, adult: Secondary | ICD-10-CM | POA: Diagnosis not present

## 2022-07-13 MED ORDER — AMPHETAMINE-DEXTROAMPHETAMINE 30 MG PO TABS
30.0000 mg | ORAL_TABLET | Freq: Two times a day (BID) | ORAL | 0 refills | Status: DC
  Filled 2022-07-13: qty 180, 60d supply, fill #0
  Filled 2022-07-30: qty 180, 90d supply, fill #0

## 2022-07-13 MED ORDER — AMPHETAMINE-DEXTROAMPHETAMINE 30 MG PO TABS
30.0000 mg | ORAL_TABLET | Freq: Every day | ORAL | 0 refills | Status: DC
  Filled 2022-07-13: qty 90, 60d supply, fill #0
  Filled 2022-07-30: qty 90, 90d supply, fill #0

## 2022-07-13 MED ORDER — PROPRANOLOL HCL ER 60 MG PO CP24
60.0000 mg | ORAL_CAPSULE | Freq: Every evening | ORAL | 1 refills | Status: DC
  Filled 2022-07-13: qty 90, 90d supply, fill #0
  Filled 2022-07-30 (×2): qty 30, 30d supply, fill #0

## 2022-07-17 ENCOUNTER — Other Ambulatory Visit (HOSPITAL_COMMUNITY): Payer: Self-pay

## 2022-07-26 ENCOUNTER — Other Ambulatory Visit (HOSPITAL_COMMUNITY): Payer: Self-pay

## 2022-07-30 ENCOUNTER — Other Ambulatory Visit (HOSPITAL_COMMUNITY): Payer: Self-pay

## 2022-08-02 ENCOUNTER — Inpatient Hospital Stay: Payer: 59 | Attending: Hematology

## 2022-08-02 DIAGNOSIS — Z79810 Long term (current) use of selective estrogen receptor modulators (SERMs): Secondary | ICD-10-CM | POA: Diagnosis not present

## 2022-08-02 DIAGNOSIS — D0502 Lobular carcinoma in situ of left breast: Secondary | ICD-10-CM | POA: Insufficient documentation

## 2022-08-02 DIAGNOSIS — E559 Vitamin D deficiency, unspecified: Secondary | ICD-10-CM | POA: Diagnosis not present

## 2022-08-02 LAB — CBC WITH DIFFERENTIAL/PLATELET
Abs Immature Granulocytes: 0.01 10*3/uL (ref 0.00–0.07)
Basophils Absolute: 0.1 10*3/uL (ref 0.0–0.1)
Basophils Relative: 1 %
Eosinophils Absolute: 0.2 10*3/uL (ref 0.0–0.5)
Eosinophils Relative: 3 %
HCT: 34.8 % — ABNORMAL LOW (ref 36.0–46.0)
Hemoglobin: 11.1 g/dL — ABNORMAL LOW (ref 12.0–15.0)
Immature Granulocytes: 0 %
Lymphocytes Relative: 32 %
Lymphs Abs: 2 10*3/uL (ref 0.7–4.0)
MCH: 26 pg (ref 26.0–34.0)
MCHC: 31.9 g/dL (ref 30.0–36.0)
MCV: 81.5 fL (ref 80.0–100.0)
Monocytes Absolute: 0.6 10*3/uL (ref 0.1–1.0)
Monocytes Relative: 9 %
Neutro Abs: 3.4 10*3/uL (ref 1.7–7.7)
Neutrophils Relative %: 55 %
Platelets: 409 10*3/uL — ABNORMAL HIGH (ref 150–400)
RBC: 4.27 MIL/uL (ref 3.87–5.11)
RDW: 16 % — ABNORMAL HIGH (ref 11.5–15.5)
WBC: 6.2 10*3/uL (ref 4.0–10.5)
nRBC: 0 % (ref 0.0–0.2)

## 2022-08-02 LAB — COMPREHENSIVE METABOLIC PANEL
ALT: 29 U/L (ref 0–44)
AST: 21 U/L (ref 15–41)
Albumin: 3.8 g/dL (ref 3.5–5.0)
Alkaline Phosphatase: 57 U/L (ref 38–126)
Anion gap: 8 (ref 5–15)
BUN: 12 mg/dL (ref 6–20)
CO2: 23 mmol/L (ref 22–32)
Calcium: 9 mg/dL (ref 8.9–10.3)
Chloride: 105 mmol/L (ref 98–111)
Creatinine, Ser: 0.8 mg/dL (ref 0.44–1.00)
GFR, Estimated: >60 mL/min (ref >60)
Glucose, Bld: 77 mg/dL (ref 70–99)
Potassium: 4 mmol/L (ref 3.5–5.1)
Sodium: 136 mmol/L (ref 135–145)
Total Bilirubin: 0.4 mg/dL (ref 0.3–1.2)
Total Protein: 7 g/dL (ref 6.5–8.1)

## 2022-08-02 LAB — VITAMIN D 25 HYDROXY (VIT D DEFICIENCY, FRACTURES): Vit D, 25-Hydroxy: 38.34 ng/mL (ref 30–100)

## 2022-08-09 ENCOUNTER — Inpatient Hospital Stay: Payer: 59 | Admitting: Hematology

## 2022-08-21 NOTE — Progress Notes (Signed)
[facility] [address] [place] [address]    Clinic Day:  08/22/2022  Referring physician: Donell Beers, FNP  Patient Care Team: Donell Beers, FNP as PCP - General (Nurse Practitioner) Antoine Poche, MD as PCP - Cardiology (Cardiology) Doreatha Massed, MD as Medical Oncologist (Medical Oncology)   ASSESSMENT & PLAN:   Assessment: 1.  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 -Left breast biopsy, 1 o'clock position on 04/07/2020-LCIS involving a fibroadenoma. -MRI of the breast on 05/11/2020 with 6 mm enhancing mass in the posterior upper outer left breast.  No evidence of malignant cells within the breast.  No axillary lymph nodes. -Left lumpectomy on 05/23/2020 with 0.6 cm LCIS involving a fibroadenoma, LCIS focally less than 0.1 cm from lateral margin.  ER 70% positive, PR 90% positive, HER-2 negative.  No invasive component present. - Tamoxifen started on 06/14/2020.   2.  Social/family history: -She works as a Sports coach at [employer].  Non-smoker. 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  No malignancies on her mother side.    Plan: 1.  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 - Mammogram on 04/17/2022: BI-RADS Category 2. - She is tolerating tamoxifen reasonably well. - Labs: Normal LFTs.  CBC grossly normal.  Hemoglobin is 11.1.  She reportedly donated blood on 08/13/2022. - Recommend her to start on iron tablet 3 times weekly with stool softener. - RTC 6 months for follow-up.  Will plan to check CBC, ferritin and iron panel.  2.  Vitamin D deficiency: - Continue vitamin D 5000 units daily.  Vitamin D level improved to 38 from 14.     Orders Placed This Encounter  Procedures   MM DIAG BREAST TOMO BILATERAL    Nas No implants [facility]    Standing Status:   Future    Standing Expiration Date:   08/22/2023    Order Specific Question:   Reason for Exam (SYMPTOM  OR DIAGNOSIS REQUIRED)    Answer:   history of LCIS 2022, yearly mammogram    Order Specific Question:   Is the patient pregnant?    Answer:   No    Order Specific Question:   Preferred imaging location?    Answer:   [facility]    Order Specific Question:   Release to patient    Answer:   Immediate   CBC with Differential    Standing Status:   Future    Standing Expiration Date:   08/22/2023   Comprehensive metabolic panel    Standing Status:   Future    Standing Expiration Date:   08/22/2023   Vitamin D 25 hydroxy    Standing Status:   Future    Standing Expiration Date:   08/22/2023   Ferritin    Standing Status:   Future    Standing Expiration Date:   08/22/2023   Iron and TIBC ([facility] ONLY)    Standing Status:   Future    Standing Expiration Date:   08/22/2023      I,Katie Daubenspeck,acting as a scribe for Doreatha Massed, MD.,have documented all relevant documentation on the behalf of Doreatha Massed, MD,as  directed by  Doreatha Massed, MD while in the presence of Doreatha Massed, MD.   I, Doreatha Massed MD, have reviewed the above documentation for accuracy and completeness, and I agree with the above.   Doreatha Massed, MD   5/15/20245:08 PM  CHIEF COMPLAINT:   Diagnosis: left breast LCIS    Cancer Staging  No matching staging information was found for the patient.   Prior Therapy: Left lumpectomy 05/23/20  Current Therapy:  tamoxifen   HISTORY OF PRESENT ILLNESS:   Oncology History   No history exists.     INTERVAL HISTORY:   Erika Galloway is a 47 y.o. female presenting to clinic today for follow up of left breast LCIS. She was last seen by me on 02/01/22.  Since her last  visit, she underwent annual bilateral mammogram with left breast US on 04/17/22 showing no findings of malignancy in either breast.  Today, she states that she is doing well overall. Her appetite level is at 100%. Her energy level is at 100%.  PAST MEDICAL HISTORY:   Past Medical History: Past Medical History:  Diagnosis Date   Abnormal Pap smear of cervix    Depression    History of COVID-19 04/19/2020    Surgical History: Past Surgical History:  Procedure Laterality Date   ADENOIDECTOMY     BREAST LUMPECTOMY WITH RADIOACTIVE SEED LOCALIZATION Left 05/23/2020   Procedure: RADIOACTIVE SEED GUIDED LEFT BREAST LUMPECTOMY;  Surgeon: Griselda Miner, MD;  Location: [facility];  Service: General;  Laterality: Left;    Social History: Social History   Socioeconomic History   Marital status: Married    Spouse name: Not on file   Number of children: Not on file   Years of education: Not on file   Highest education level: Not on file  Occupational History   Not on file  Tobacco Use   Smoking status: Never   Smokeless tobacco: Never  Vaping Use   Vaping Use: Never used  Substance and Sexual Activity   Alcohol use: Never   Drug use: Never   Sexual activity: Not on file  Other Topics Concern   Not on file  Social History Narrative   Not on file   Social Determinants of Health   Financial Resource Strain: Low Risk  (05/17/2020)   Overall Financial Resource Strain (CARDIA)    Difficulty of Paying Living Expenses: Not very hard  Food Insecurity: No Food Insecurity (05/17/2020)   Hunger Vital Sign    Worried About Running Out of Food in the Last Year: Never true    Ran Out of Food in the Last Year: Never true  Transportation Needs: No Transportation Needs (05/17/2020)   PRAPARE - Administrator, Civil Service (Medical): No    Lack of Transportation (Non-Medical): No  Physical Activity: Inactive (05/17/2020)   Exercise Vital Sign    Days of Exercise per  Week: 0 days    Minutes of Exercise per Session: 0 min  Stress: No Stress Concern Present (05/17/2020)   [employer] of Occupational Health - Occupational Stress Questionnaire    Feeling of Stress : Only a little  Social Connections: Moderately Integrated (05/17/2020)   Social Connection and Isolation Panel [NHANES]    Frequency of Communication with Friends and Family: More than three times a week    Frequency of Social Gatherings with Friends and Family: Three times a week    Attends Religious Services: 1 to 4 times per year  Active Member of Clubs or Organizations: No    Attends Banker Meetings: Never    Marital Status: Married  Catering manager Violence: Not At Risk (05/17/2020)   Humiliation, Afraid, Rape, and Kick questionnaire    Fear of Current or Ex-Partner: No    Emotionally Abused: No    Physically Abused: No    Sexually Abused: No    Family History: Family History  Problem Relation Age of Onset   Supraventricular tachycardia Mother     Current Medications:  Current Outpatient Medications:    amphetamine-dextroamphetamine (ADDERALL) 30 MG tablet, Take 1 tablet by mouth daily in the afternoon, Disp: 90 tablet, Rfl: 0   amphetamine-dextroamphetamine (ADDERALL) 30 MG tablet, Take 1 tablet by mouth daily. (Takes a total of 3 tablets daily), Disp: 90 tablet, Rfl: 0   amphetamine-dextroamphetamine (ADDERALL) 30 MG tablet, Take 1 tablet by mouth daily. (patient takes a total of 3 tablets daily), Disp: 90 tablet, Rfl: 0   amphetamine-dextroamphetamine (ADDERALL) 30 MG tablet, Take 1 tablet by mouth 2 (two) times daily. (Patient takes a total of 3 tablets daily), Disp: 180 tablet, Rfl: 0   propranolol ER (INDERAL LA) 60 MG 24 hr capsule, Take 1 capsule (60 mg total) by mouth every evening., Disp: 30 capsule, Rfl: 1   SUMAtriptan (IMITREX) 100 MG tablet, Take 1 tablet (100 mg total) by mouth as needed., Disp: 9 tablet, Rfl: 1   SUMAtriptan (IMITREX) 100 MG tablet,  Take 1 tablet (100 mg total) by mouth as needed., Disp: 9 tablet, Rfl: 1   tamoxifen (NOLVADEX) 20 MG tablet, Take 1 tablet (20 mg total) by mouth daily., Disp: 30 tablet, Rfl: 6   Allergies: No Known Allergies  REVIEW OF SYSTEMS:   Review of Systems  Constitutional:  Negative for chills, fatigue and fever.  HENT:   Negative for lump/mass, mouth sores, nosebleeds, sore throat and trouble swallowing.   Eyes:  Negative for eye problems.  Respiratory:  Negative for cough and shortness of breath.   Cardiovascular:  Negative for chest pain, leg swelling and palpitations.  Gastrointestinal:  Negative for abdominal pain, constipation, diarrhea, nausea and vomiting.  Genitourinary:  Negative for bladder incontinence, difficulty urinating, dysuria, frequency, hematuria and nocturia.   Musculoskeletal:  Negative for arthralgias, back pain, flank pain, myalgias and neck pain.  Skin:  Negative for itching and rash.  Neurological:  Negative for dizziness, headaches and numbness.  Hematological:  Does not bruise/bleed easily.  Psychiatric/Behavioral:  Negative for depression, sleep disturbance and suicidal ideas. The patient is not nervous/anxious.   All other systems reviewed and are negative.    VITALS:   Blood pressure 137/83, pulse (!) 105, temperature 98 F (36.7 C), temperature source Oral, resp. rate 18, weight 172 lb (78 kg), SpO2 100 %.  Wt Readings from Last 3 Encounters:  08/22/22 172 lb (78 kg)  08/01/21 159 lb (72.1 kg)  07/03/21 159 lb (72.1 kg)    Body mass index is 28.62 kg/m.  Performance status (ECOG): 0 - Asymptomatic  PHYSICAL EXAM:   Physical Exam Vitals and nursing note reviewed. Exam conducted with a chaperone present.  Constitutional:      Appearance: Normal appearance.  Cardiovascular:     Rate and Rhythm: Normal rate and regular rhythm.     Pulses: Normal pulses.     Heart sounds: Normal heart sounds.  Pulmonary:     Effort: Pulmonary effort is normal.      Breath sounds: Normal breath sounds.  Abdominal:  Palpations: Abdomen is soft. There is no hepatomegaly, splenomegaly or mass.     Tenderness: There is no abdominal tenderness.  Musculoskeletal:     Right lower leg: No edema.     Left lower leg: No edema.  Lymphadenopathy:     Cervical: No cervical adenopathy.     Right cervical: No superficial, deep or posterior cervical adenopathy.    Left cervical: No superficial, deep or posterior cervical adenopathy.     Upper Body:     Right upper body: No supraclavicular or axillary adenopathy.     Left upper body: No supraclavicular or axillary adenopathy.  Neurological:     General: No focal deficit present.     Mental Status: She is alert and oriented to person, place, and time.  Psychiatric:        Mood and Affect: Mood normal.        Behavior: Behavior normal.     LABS:      Latest Ref Rng & Units 08/02/2022    2:30 PM 01/23/2022    1:54 PM 06/26/2021    9:19 AM  CBC  WBC 4.0 - 10.5 K/uL 6.2  4.3  3.8   Hemoglobin 12.0 - 15.0 g/dL 16.1  09.6  04.5   Hematocrit 36.0 - 46.0 % 34.8  35.1  37.6   Platelets 150 - 400 K/uL 409  343  434       Latest Ref Rng & Units 08/02/2022    2:30 PM 01/23/2022    1:54 PM 06/26/2021    9:19 AM  CMP  Glucose 70 - 99 mg/dL 77  91  92   BUN 6 - 20 mg/dL 12  11  14    Creatinine 0.44 - 1.00 mg/dL 4.09  8.11  9.14   Sodium 135 - 145 mmol/L 136  137  138   Potassium 3.5 - 5.1 mmol/L 4.0  3.9  4.1   Chloride 98 - 111 mmol/L 105  107  104   CO2 22 - 32 mmol/L 23  23  24    Calcium 8.9 - 10.3 mg/dL 9.0  9.1  9.4   Total Protein 6.5 - 8.1 g/dL 7.0  6.9  7.7   Total Bilirubin 0.3 - 1.2 mg/dL 0.4  0.6  0.4   Alkaline Phos 38 - 126 U/L 57  53  49   AST 15 - 41 U/L 21  19  19    ALT 0 - 44 U/L 29  19  15       No results found for: "CEA1", "CEA" / No results found for: "CEA1", "CEA" No results found for: "PSA1" No results found for: "NWG956" No results found for: "CAN125"  No results found for:  "TOTALPROTELP", "ALBUMINELP", "A1GS", "A2GS", "BETS", "BETA2SER", "GAMS", "MSPIKE", "SPEI" No results found for: "TIBC", "FERRITIN", "IRONPCTSAT" Lab Results  Component Value Date   LDH 145 06/28/2007   LDH 152 06/25/2007     STUDIES:   No results found.

## 2022-08-22 ENCOUNTER — Other Ambulatory Visit: Payer: Self-pay | Admitting: Hematology

## 2022-08-22 ENCOUNTER — Inpatient Hospital Stay: Payer: 59 | Attending: Hematology | Admitting: Hematology

## 2022-08-22 VITALS — BP 137/83 | HR 105 | Temp 98.0°F | Resp 18 | Wt 172.0 lb

## 2022-08-22 DIAGNOSIS — Z79810 Long term (current) use of selective estrogen receptor modulators (SERMs): Secondary | ICD-10-CM | POA: Insufficient documentation

## 2022-08-22 DIAGNOSIS — D0502 Lobular carcinoma in situ of left breast: Secondary | ICD-10-CM

## 2022-08-22 DIAGNOSIS — E559 Vitamin D deficiency, unspecified: Secondary | ICD-10-CM

## 2022-08-22 DIAGNOSIS — Z79899 Other long term (current) drug therapy: Secondary | ICD-10-CM | POA: Insufficient documentation

## 2022-08-22 NOTE — Patient Instructions (Addendum)
[facility]  Discharge Instructions  You were seen and examined today by Dr. Ellin Saba.  Dr. Ellin Saba discussed your most recent lab work which is stable. Your hemoglobin did drop some. Start taking OTC Iron tablet (325mg  daily). Take a stool softener if you find that it constipates you.  You are due to a mammogram in January.  Continue Tamoxifen and Vitamin D as you are.  Follow-up as scheduled.  Thank you for choosing [facility] to provide your oncology and hematology care.   To afford each patient quality time with our provider, please arrive at least 15 minutes before your scheduled appointment time. You may need to reschedule your appointment if you arrive late (10 or more minutes). Arriving late affects you and other patients whose appointments are after yours.  Also, if you miss three or more appointments without notifying the office, you may be dismissed from the clinic at the provider's discretion.    Again, thank you for choosing [facility].  Our hope is that these requests will decrease the amount of time that you wait before being seen by our physicians.   If you have a lab appointment with the Cancer Center - please note that after April 8th, all labs will be drawn in the cancer center.  You do not have to check in or register with the main entrance as you have in the past but will complete your check-in at the cancer center.            _____________________________________________________________  Should you have questions after your visit to [facility], please contact our office at [phone] and follow the prompts.  Our office hours are 8:00 a.m. to 4:30 p.m. Monday - Thursday and 8:00 a.m. to 2:30 p.m. Friday.  Please note that voicemails left after 4:00 p.m. may not be returned until the following business day.  We are closed weekends and all major holidays.  You do have access to a  nurse 24-7, just call the main number to the clinic [phone] and do not press any options, hold on the line and a nurse will answer the phone.    For prescription refill requests, have your pharmacy contact our office and allow 72 hours.    Masks are no longer required in the cancer centers. If you would like for your care team to wear a mask while they are taking care of you, please let them know. You may have one support person who is at least 45 years old accompany you for your appointments.

## 2022-10-19 ENCOUNTER — Other Ambulatory Visit (HOSPITAL_COMMUNITY): Payer: Self-pay

## 2022-10-19 ENCOUNTER — Other Ambulatory Visit: Payer: Self-pay | Admitting: Hematology

## 2022-10-19 DIAGNOSIS — D0502 Lobular carcinoma in situ of left breast: Secondary | ICD-10-CM

## 2022-10-19 MED ORDER — TAMOXIFEN CITRATE 20 MG PO TABS
20.0000 mg | ORAL_TABLET | Freq: Every day | ORAL | 6 refills | Status: DC
Start: 2022-10-19 — End: 2023-11-14
  Filled 2022-10-19: qty 30, 30d supply, fill #0
  Filled 2022-12-27: qty 30, 30d supply, fill #1
  Filled 2023-06-02: qty 30, 30d supply, fill #2
  Filled 2023-08-20: qty 30, 30d supply, fill #3
  Filled 2023-09-13 (×2): qty 30, 30d supply, fill #4

## 2022-10-20 ENCOUNTER — Other Ambulatory Visit (HOSPITAL_COMMUNITY): Payer: Self-pay

## 2022-10-22 ENCOUNTER — Other Ambulatory Visit: Payer: Self-pay

## 2022-11-05 ENCOUNTER — Other Ambulatory Visit (HOSPITAL_COMMUNITY): Payer: Self-pay

## 2022-11-05 DIAGNOSIS — F900 Attention-deficit hyperactivity disorder, predominantly inattentive type: Secondary | ICD-10-CM | POA: Diagnosis not present

## 2022-11-05 DIAGNOSIS — G43919 Migraine, unspecified, intractable, without status migrainosus: Secondary | ICD-10-CM | POA: Diagnosis not present

## 2022-11-05 DIAGNOSIS — F331 Major depressive disorder, recurrent, moderate: Secondary | ICD-10-CM | POA: Diagnosis not present

## 2022-11-05 MED ORDER — AMPHETAMINE-DEXTROAMPHETAMINE 30 MG PO TABS
30.0000 mg | ORAL_TABLET | Freq: Three times a day (TID) | ORAL | 0 refills | Status: DC
  Filled 2022-11-05: qty 90, 90d supply, fill #0

## 2022-11-05 MED ORDER — PROPRANOLOL HCL ER 60 MG PO CP24
60.0000 mg | ORAL_CAPSULE | Freq: Every evening | ORAL | 1 refills | Status: DC
  Filled 2022-11-05: qty 30, 30d supply, fill #0

## 2022-11-05 MED ORDER — AMPHETAMINE-DEXTROAMPHETAMINE 30 MG PO TABS
30.0000 mg | ORAL_TABLET | Freq: Two times a day (BID) | ORAL | 0 refills | Status: DC
  Filled 2022-11-05: qty 180, 90d supply, fill #0

## 2022-11-06 ENCOUNTER — Other Ambulatory Visit (HOSPITAL_COMMUNITY): Payer: Self-pay

## 2022-11-09 ENCOUNTER — Other Ambulatory Visit (HOSPITAL_COMMUNITY): Payer: Self-pay

## 2022-12-27 ENCOUNTER — Other Ambulatory Visit (HOSPITAL_COMMUNITY): Payer: Self-pay

## 2022-12-28 ENCOUNTER — Other Ambulatory Visit (HOSPITAL_COMMUNITY): Payer: Self-pay

## 2022-12-28 ENCOUNTER — Other Ambulatory Visit: Payer: Self-pay

## 2022-12-28 MED ORDER — SUMATRIPTAN SUCCINATE 100 MG PO TABS
100.0000 mg | ORAL_TABLET | ORAL | 1 refills | Status: DC | PRN
  Filled 2022-12-28: qty 9, 30d supply, fill #0

## 2022-12-29 ENCOUNTER — Other Ambulatory Visit (HOSPITAL_COMMUNITY): Payer: Self-pay

## 2023-01-09 ENCOUNTER — Emergency Department (HOSPITAL_COMMUNITY)
Admission: EM | Admit: 2023-01-09 | Discharge: 2023-01-09 | Disposition: A | Discharge status: Home / Self Care | Payer: 59 | Attending: Emergency Medicine | Admitting: Emergency Medicine

## 2023-01-09 ENCOUNTER — Encounter (HOSPITAL_COMMUNITY): Payer: Self-pay

## 2023-01-09 DIAGNOSIS — E869 Volume depletion, unspecified: Secondary | ICD-10-CM | POA: Insufficient documentation

## 2023-01-09 DIAGNOSIS — R55 Syncope and collapse: Secondary | ICD-10-CM | POA: Diagnosis not present

## 2023-01-09 NOTE — ED Notes (Signed)
ED Provider at bedside. 

## 2023-01-09 NOTE — ED Triage Notes (Signed)
Pt presents d/t near syncope and nausea.  Pt reports giving blood this morning.  Sts "I ran up the stairs and got real lightheaded."  Pt reports she did not eat breakfast this morning.  Denies pain.

## 2023-01-09 NOTE — ED Provider Notes (Signed)
[place] EMERGENCY DEPARTMENT AT [facility] Provider Note   CSN: [id number] Arrival date & time: 01/09/23  1242     History  Chief Complaint  Patient presents with   Near Syncope    Erika Galloway is a 47 y.o. female.   Near Syncope  Patient presents with near syncopal episode.  States that she was coming upstairs after giving blood earlier today.  Felt lightheaded and felt like if she is going to pass out.  Had not eaten breakfast this morning.  Feeling somewhat better now.  No chest pain.  No trouble breathing.  No abdominal pain.  Does have a history of mild anemia.    Past Medical History:  Diagnosis Date   Abnormal Pap smear of cervix    Depression    History of COVID-19 04/19/2020    Home Medications Prior to Admission medications   Medication Sig Start Date End Date Taking? Authorizing Provider  amphetamine-dextroamphetamine (ADDERALL) 30 MG tablet Take 1 tablet by mouth daily in the afternoon 09/05/21     amphetamine-dextroamphetamine (ADDERALL) 30 MG tablet Take 1 tablet by mouth daily. (Takes a total of 3 tablets daily) 04/16/22     amphetamine-dextroamphetamine (ADDERALL) 30 MG tablet Take 1 tablet by mouth daily. (patient takes a total of 3 tablets daily) 07/13/22     amphetamine-dextroamphetamine (ADDERALL) 30 MG tablet Take 1 tablet by mouth 2 (two) times daily. (Patient takes a total of 3 tablets daily) 07/13/22     amphetamine-dextroamphetamine (ADDERALL) 30 MG tablet Take 1 tablet by mouth 2 (two) times daily. 11/05/22     amphetamine-dextroamphetamine (ADDERALL) 30 MG tablet Take 1 tablet by mouth every day  (total 3/day) 11/05/22     propranolol ER (INDERAL LA) 60 MG 24 hr capsule Take 1 capsule (60 mg total) by mouth every evening. 04/16/22     propranolol ER (INDERAL LA) 60 MG 24 hr capsule Take 1 capsule (60 mg total) by mouth every evening. 11/05/22     SUMAtriptan (IMITREX) 100 MG tablet Take 1 tablet (100 mg total) by mouth as needed. 06/17/22      SUMAtriptan (IMITREX) 100 MG tablet Take 1 tablet (100 mg total) by mouth as needed. 12/28/22     tamoxifen (NOLVADEX) 20 MG tablet Take 1 tablet (20 mg total) by mouth daily. 10/19/22   Doreatha Massed, MD  citalopram (CELEXA) 20 MG tablet Take 1 tablet (20 mg total) by mouth daily. 07/28/20 08/04/20  Royann Shivers, PA-C  DULoxetine (CYMBALTA) 30 MG capsule Take 1 capsule (30 mg total) by mouth daily. 07/28/20 08/04/20    sertraline (ZOLOFT) 25 MG tablet Take 1 Tablet by mouth every day 07/25/20 07/28/20        Allergies    Patient has no known allergies.    Review of Systems   Review of Systems  Cardiovascular:  Positive for near-syncope.    Physical Exam Updated Vital Signs BP 110/66   Pulse 96   Temp (!) 97.3 F (36.3 C) (Oral)   Resp (!) 22   Ht 5\' 5"  (1.651 m)   Wt 72.6 kg   SpO2 100%   BMI 26.63 kg/m  Physical Exam Vitals reviewed.  Cardiovascular:     Rate and Rhythm: Regular rhythm.  Skin:    Coloration: Skin is not pale.  Neurological:     Mental Status: She is alert.     ED Results / Procedures / Treatments   Labs (all labs ordered are listed, but only  abnormal results are displayed) Labs Reviewed - No data to display  EKG None  Radiology No results found.  Procedures Procedures    Medications Ordered in ED Medications - No data to display  ED Course/ Medical Decision Making/ A&P                                 Medical Decision Making  Patient with near syncopal episode after donating blood.  Had not eaten today.  Feeling somewhat better now.  Discussed with patient.  I have reviewed previous blood work although it was from a few months ago.  Will try some oral hydration and oral fluid.  Blood pressure improved as has her heart rate.  If continues to feel bad may need blood work and fluids.   Feels better.  Has tolerated orals.  Blood pressure good.        Final Clinical Impression(s) / ED Diagnoses Final diagnoses:  Near  syncope  Volume depletion    Rx / DC Orders ED Discharge Orders     None         Benjiman Core, MD 01/09/23 1523

## 2023-01-09 NOTE — ED Notes (Signed)
Pt wheeled to ED room 2. Experienced an episode of syncope after giving blood earlier today. She denied having breakfast and fluids before giving blood. Pt stated she walked up stairs and felt she was going to pass out. Denied falling or LOC.   Pt VS WNL and denied any discomfort at the moment. No clamminess/sweating felt or seen.

## 2023-01-09 NOTE — ED Notes (Signed)
Meal given

## 2023-02-18 ENCOUNTER — Inpatient Hospital Stay: Payer: 59

## 2023-02-25 ENCOUNTER — Inpatient Hospital Stay: Payer: 59 | Admitting: Hematology

## 2023-02-28 ENCOUNTER — Other Ambulatory Visit: Payer: Self-pay

## 2023-02-28 ENCOUNTER — Other Ambulatory Visit (HOSPITAL_COMMUNITY): Payer: Self-pay

## 2023-02-28 DIAGNOSIS — F331 Major depressive disorder, recurrent, moderate: Secondary | ICD-10-CM | POA: Diagnosis not present

## 2023-02-28 DIAGNOSIS — G43919 Migraine, unspecified, intractable, without status migrainosus: Secondary | ICD-10-CM | POA: Diagnosis not present

## 2023-02-28 DIAGNOSIS — Z6825 Body mass index (BMI) 25.0-25.9, adult: Secondary | ICD-10-CM | POA: Diagnosis not present

## 2023-02-28 MED ORDER — AMPHETAMINE-DEXTROAMPHETAMINE 30 MG PO TABS
30.0000 mg | ORAL_TABLET | Freq: Two times a day (BID) | ORAL | 0 refills | Status: DC
Start: 2023-02-28 — End: 2024-01-02
  Filled 2023-02-28: qty 180, 90d supply, fill #0

## 2023-02-28 MED ORDER — SUMATRIPTAN SUCCINATE 100 MG PO TABS
ORAL_TABLET | ORAL | 0 refills | Status: DC
Start: 2023-02-28 — End: 2024-01-02
  Filled 2023-02-28 – 2023-03-08 (×2): qty 27, 90d supply, fill #0

## 2023-02-28 MED ORDER — AMPHETAMINE-DEXTROAMPHETAMINE 30 MG PO TABS
30.0000 mg | ORAL_TABLET | Freq: Every day | ORAL | 0 refills | Status: AC
Start: 2023-02-28 — End: ?
  Filled 2023-02-28: qty 90, 90d supply, fill #0

## 2023-03-01 ENCOUNTER — Other Ambulatory Visit (HOSPITAL_COMMUNITY): Payer: Self-pay

## 2023-03-05 ENCOUNTER — Other Ambulatory Visit: Payer: Self-pay

## 2023-03-08 ENCOUNTER — Other Ambulatory Visit (HOSPITAL_COMMUNITY): Payer: Self-pay

## 2023-03-12 ENCOUNTER — Inpatient Hospital Stay: Payer: 59 | Attending: Hematology

## 2023-03-12 DIAGNOSIS — Z79810 Long term (current) use of selective estrogen receptor modulators (SERMs): Secondary | ICD-10-CM | POA: Insufficient documentation

## 2023-03-12 DIAGNOSIS — E559 Vitamin D deficiency, unspecified: Secondary | ICD-10-CM | POA: Insufficient documentation

## 2023-03-12 DIAGNOSIS — C50412 Malignant neoplasm of upper-outer quadrant of left female breast: Secondary | ICD-10-CM | POA: Insufficient documentation

## 2023-03-12 DIAGNOSIS — D509 Iron deficiency anemia, unspecified: Secondary | ICD-10-CM | POA: Insufficient documentation

## 2023-03-12 DIAGNOSIS — Z170 Estrogen receptor positive status [ER+]: Secondary | ICD-10-CM | POA: Diagnosis not present

## 2023-03-12 DIAGNOSIS — Z79899 Other long term (current) drug therapy: Secondary | ICD-10-CM | POA: Diagnosis not present

## 2023-03-12 DIAGNOSIS — D0502 Lobular carcinoma in situ of left breast: Secondary | ICD-10-CM

## 2023-03-12 LAB — COMPREHENSIVE METABOLIC PANEL
ALT: 39 U/L (ref 0–44)
AST: 33 U/L (ref 15–41)
Albumin: 4.1 g/dL (ref 3.5–5.0)
Alkaline Phosphatase: 57 U/L (ref 38–126)
Anion gap: 11 (ref 5–15)
BUN: 7 mg/dL (ref 6–20)
CO2: 22 mmol/L (ref 22–32)
Calcium: 9.3 mg/dL (ref 8.9–10.3)
Chloride: 103 mmol/L (ref 98–111)
Creatinine, Ser: 0.7 mg/dL (ref 0.44–1.00)
GFR, Estimated: >60 mL/min (ref >60)
Glucose, Bld: 92 mg/dL (ref 70–99)
Potassium: 3.8 mmol/L (ref 3.5–5.1)
Sodium: 136 mmol/L (ref 135–145)
Total Bilirubin: 0.8 mg/dL (ref <1.2)
Total Protein: 7.4 g/dL (ref 6.5–8.1)

## 2023-03-12 LAB — CBC WITH DIFFERENTIAL/PLATELET
Abs Immature Granulocytes: 0 10*3/uL (ref 0.00–0.07)
Basophils Absolute: 0 10*3/uL (ref 0.0–0.1)
Basophils Relative: 1 %
Eosinophils Absolute: 0.1 10*3/uL (ref 0.0–0.5)
Eosinophils Relative: 3 %
HCT: 33.7 % — ABNORMAL LOW (ref 36.0–46.0)
Hemoglobin: 9.9 g/dL — ABNORMAL LOW (ref 12.0–15.0)
Immature Granulocytes: 0 %
Lymphocytes Relative: 42 %
Lymphs Abs: 1.6 10*3/uL (ref 0.7–4.0)
MCH: 22.5 pg — ABNORMAL LOW (ref 26.0–34.0)
MCHC: 29.4 g/dL — ABNORMAL LOW (ref 30.0–36.0)
MCV: 76.6 fL — ABNORMAL LOW (ref 80.0–100.0)
Monocytes Absolute: 0.4 10*3/uL (ref 0.1–1.0)
Monocytes Relative: 9 %
Neutro Abs: 1.7 10*3/uL (ref 1.7–7.7)
Neutrophils Relative %: 45 %
Platelets: 443 10*3/uL — ABNORMAL HIGH (ref 150–400)
RBC: 4.4 MIL/uL (ref 3.87–5.11)
RDW: 15.9 % — ABNORMAL HIGH (ref 11.5–15.5)
WBC: 3.9 10*3/uL — ABNORMAL LOW (ref 4.0–10.5)
nRBC: 0 % (ref 0.0–0.2)

## 2023-03-12 LAB — IRON AND TIBC
Iron: 29 ug/dL (ref 28–170)
Saturation Ratios: 5 % — ABNORMAL LOW (ref 10.4–31.8)
TIBC: 535 ug/dL — ABNORMAL HIGH (ref 250–450)
UIBC: 506 ug/dL

## 2023-03-12 LAB — VITAMIN D 25 HYDROXY (VIT D DEFICIENCY, FRACTURES): Vit D, 25-Hydroxy: 21.26 ng/mL — ABNORMAL LOW (ref 30–100)

## 2023-03-12 LAB — FERRITIN: Ferritin: 4 ng/mL — ABNORMAL LOW (ref 11–307)

## 2023-03-13 ENCOUNTER — Inpatient Hospital Stay (HOSPITAL_BASED_OUTPATIENT_CLINIC_OR_DEPARTMENT_OTHER): Payer: 59 | Admitting: Hematology

## 2023-03-13 DIAGNOSIS — D508 Other iron deficiency anemias: Secondary | ICD-10-CM

## 2023-03-13 DIAGNOSIS — D0502 Lobular carcinoma in situ of left breast: Secondary | ICD-10-CM

## 2023-03-13 DIAGNOSIS — Z79899 Other long term (current) drug therapy: Secondary | ICD-10-CM | POA: Diagnosis not present

## 2023-03-13 DIAGNOSIS — C50412 Malignant neoplasm of upper-outer quadrant of left female breast: Secondary | ICD-10-CM | POA: Diagnosis not present

## 2023-03-13 DIAGNOSIS — Z79810 Long term (current) use of selective estrogen receptor modulators (SERMs): Secondary | ICD-10-CM | POA: Diagnosis not present

## 2023-03-13 DIAGNOSIS — D509 Iron deficiency anemia, unspecified: Secondary | ICD-10-CM | POA: Diagnosis not present

## 2023-03-13 DIAGNOSIS — E559 Vitamin D deficiency, unspecified: Secondary | ICD-10-CM | POA: Diagnosis not present

## 2023-03-13 DIAGNOSIS — Z170 Estrogen receptor positive status [ER+]: Secondary | ICD-10-CM | POA: Diagnosis not present

## 2023-03-13 NOTE — Progress Notes (Signed)
[facility] [address] [place] [address]    Clinic Day:  03/13/2023  Referring physician: Donell Beers, FNP  Patient Care Team: Donell Beers, FNP as PCP - General (Nurse Practitioner) Antoine Poche, MD as PCP - Cardiology (Cardiology) Doreatha Massed, MD as Medical Oncologist (Medical Oncology)   ASSESSMENT & PLAN:   Assessment: 1.  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 -Left breast biopsy, 1 o'clock position on 04/07/2020-LCIS involving a fibroadenoma. -MRI of the breast on 05/11/2020 with 6 mm enhancing mass in the posterior upper outer left breast.  No evidence of malignant cells within the breast.  No axillary lymph nodes. -Left lumpectomy on 05/23/2020 with 0.6 cm LCIS involving a fibroadenoma, LCIS focally less than 0.1 cm from lateral margin.  ER 70% positive, PR 90% positive, HER-2 negative.  No invasive component present. - Tamoxifen started on 06/14/2020.   2.  Social/family history: -She works as a Sports coach at [employer].  Non-smoker. 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  No malignancies on her mother side.    Plan: 1.  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  She did not see any improvement in headaches while she was off of tamoxifen.  She restarted it back. - Reviewed labs from 03/12/2023: Normal LFTs. - She will have mammogram on 04/21/2023. - Continue tamoxifen daily.  RTC 6 months for follow-up.  2.  Vitamin D deficiency: - She stopped taking vitamin D after last visit.  Vitamin D level is low at 21. - Restart vitamin D 5000 units daily.  3.  Iron deficiency anemia: - Has  moderate bleeding from menstruation.  Denies rectal bleeding.  She also donated 1 unit of blood in October.  She reports tiredness. - Ferritin is 4 and hemoglobin 9.9. - Recommend starting iron tablet daily with stool softener.  Will repeat labs in 6 months.    Orders Placed This Encounter  Procedures   VITAMIN D 25 Hydroxy (Vit-D Deficiency, Fractures)    Standing Status:   Future    Standing Expiration Date:   03/12/2024   CBC with Differential    Standing Status:   Future    Standing Expiration Date:   03/12/2024   Iron and TIBC ([facility] ONLY)    Standing Status:   Future    Standing Expiration Date:   03/12/2024   Ferritin    Standing Status:   Future    Standing Expiration Date:   03/12/2024      Mikeal Hawthorne R Teague,acting as a scribe for Doreatha Massed, MD.,have documented all relevant documentation on the behalf of Doreatha Massed, MD,as directed by  Doreatha Massed, MD while in the presence of Doreatha Massed, MD.  I, Doreatha Massed MD, have reviewed the above documentation for accuracy and completeness, and I agree with the above.    Doreatha Massed, MD   12/4/20245:24 PM  CHIEF COMPLAINT:   Diagnosis: left breast LCIS    Cancer Staging  No matching staging information was found for the patient.    Prior Therapy: Left lumpectomy 05/23/20  Current Therapy:  tamoxifen   HISTORY OF PRESENT  ILLNESS:   Oncology History   No history exists.     INTERVAL HISTORY:   Sparkles is a 47 y.o. female presenting to clinic today for follow up of left breast LCIS. She was last seen by me on 08/22/22.  Since her last visit, she presented to the ED on 01/09/23 for a near syncope episode after giving blood treated with oral hydration and fluids.   Today, she states that she is doing well overall. Her appetite level is at 100%. Her energy level is at 80%.  PAST MEDICAL HISTORY:   Past Medical History: Past Medical History:   Diagnosis Date   Abnormal Pap smear of cervix    Depression    History of COVID-19 04/19/2020    Surgical History: Past Surgical History:  Procedure Laterality Date   ADENOIDECTOMY     BREAST LUMPECTOMY WITH RADIOACTIVE SEED LOCALIZATION Left 05/23/2020   Procedure: RADIOACTIVE SEED GUIDED LEFT BREAST LUMPECTOMY;  Surgeon: Griselda Miner, MD;  Location: [facility];  Service: General;  Laterality: Left;    Social History: Social History   Socioeconomic History   Marital status: Married    Spouse name: Not on file   Number of children: Not on file   Years of education: Not on file   Highest education level: Not on file  Occupational History   Not on file  Tobacco Use   Smoking status: Never   Smokeless tobacco: Never  Vaping Use   Vaping status: Never Used  Substance and Sexual Activity   Alcohol use: Never   Drug use: Never   Sexual activity: Not on file  Other Topics Concern   Not on file  Social History Narrative   Not on file   Social Determinants of Health   Financial Resource Strain: Low Risk  (05/17/2020)   Overall Financial Resource Strain (CARDIA)    Difficulty of Paying Living Expenses: Not very hard  Food Insecurity: No Food Insecurity (05/17/2020)   Hunger Vital Sign    Worried About Running Out of Food in the Last Year: Never true    Ran Out of Food in the Last Year: Never true  Transportation Needs: No Transportation Needs (05/17/2020)   PRAPARE - Administrator, Civil Service (Medical): No    Lack of Transportation (Non-Medical): No  Physical Activity: Inactive (05/17/2020)   Exercise Vital Sign    Days of Exercise per Week: 0 days    Minutes of Exercise per Session: 0 min  Stress: No Stress Concern Present (05/17/2020)   [employer] of Occupational Health - Occupational Stress Questionnaire    Feeling of Stress : Only a little  Social Connections: Moderately Integrated (05/17/2020)   Social Connection and Isolation Panel  [NHANES]    Frequency of Communication with Friends and Family: More than three times a week    Frequency of Social Gatherings with Friends and Family: Three times a week    Attends Religious Services: 1 to 4 times per year    Active Member of Clubs or Organizations: No    Attends Banker Meetings: Never    Marital Status: Married  Catering manager Violence: Not At Risk (06/12/2022)   [facility] from [facility], [facility]   Humiliation, Afraid, Rape, and Kick questionnaire    Fear of Current or Ex-Partner: No    Emotionally Abused: No    Physically Abused: No    Sexually Abused: No    Family History: Family History  Problem Relation Age of Onset   Supraventricular tachycardia Mother     Current Medications:  Current Outpatient Medications:    amphetamine-dextroamphetamine (ADDERALL) 30 MG tablet, Take 1 tablet by mouth daily in the afternoon, Disp: 90 tablet, Rfl: 0   amphetamine-dextroamphetamine (ADDERALL) 30 MG tablet, Take 1 tablet by mouth daily. (Takes a total of 3 tablets daily), Disp: 90 tablet, Rfl: 0   amphetamine-dextroamphetamine (ADDERALL) 30 MG tablet, Take 1 tablet by mouth daily. (patient takes a total of 3 tablets daily), Disp: 90 tablet, Rfl: 0   amphetamine-dextroamphetamine (ADDERALL) 30 MG tablet, Take 1 tablet by mouth 2 (two) times daily. (Patient takes a total of 3 tablets daily), Disp: 180 tablet, Rfl: 0   amphetamine-dextroamphetamine (ADDERALL) 30 MG tablet, Take 1 tablet by mouth 2 (two) times daily., Disp: 180 tablet, Rfl: 0   amphetamine-dextroamphetamine (ADDERALL) 30 MG tablet, Take 1 tablet by mouth every day  (total 3/day), Disp: 90 tablet, Rfl: 0   amphetamine-dextroamphetamine (ADDERALL) 30 MG tablet, Take 1 tablet by mouth daily. (total 3 tablets daily), Disp: 90 tablet, Rfl: 0   amphetamine-dextroamphetamine (ADDERALL) 30 MG tablet, Take 1 tablet by mouth 2 (two) times daily. (total 3 tablets daily), Disp: 180 tablet, Rfl: 0    propranolol ER (INDERAL LA) 60 MG 24 hr capsule, Take 1 capsule (60 mg total) by mouth every evening., Disp: 30 capsule, Rfl: 1   propranolol ER (INDERAL LA) 60 MG 24 hr capsule, Take 1 capsule (60 mg total) by mouth every evening., Disp: 90 capsule, Rfl: 1   SUMAtriptan (IMITREX) 100 MG tablet, Take 1 tablet (100 mg total) by mouth as needed., Disp: 9 tablet, Rfl: 1   SUMAtriptan (IMITREX) 100 MG tablet, Take 1 tablet (100 mg total) by mouth as needed., Disp: 9 tablet, Rfl: 1   SUMAtriptan (IMITREX) 100 MG tablet, Take 1 tablet (100 mg total) by mouth as needed and directed, Disp: 27 tablet, Rfl: 0   tamoxifen (NOLVADEX) 20 MG tablet, Take 1 tablet (20 mg total) by mouth daily., Disp: 30 tablet, Rfl: 6   Allergies: No Known Allergies  REVIEW OF SYSTEMS:   Review of Systems  Constitutional:  Negative for chills, fatigue and fever.  HENT:   Negative for lump/mass, mouth sores, nosebleeds, sore throat and trouble swallowing.   Eyes:  Negative for eye problems.  Respiratory:  Negative for cough and shortness of breath.   Cardiovascular:  Negative for chest pain, leg swelling and palpitations.  Gastrointestinal:  Negative for abdominal pain, constipation, diarrhea, nausea and vomiting.  Genitourinary:  Negative for bladder incontinence, difficulty urinating, dysuria, frequency, hematuria and nocturia.   Musculoskeletal:  Negative for arthralgias, back pain, flank pain, myalgias and neck pain.  Skin:  Negative for itching and rash.  Neurological:  Negative for dizziness, headaches and numbness.  Hematological:  Does not bruise/bleed easily.  Psychiatric/Behavioral:  Negative for depression, sleep disturbance and suicidal ideas. The patient is not nervous/anxious.   All other systems reviewed and are negative.    VITALS:   There were no vitals taken for this visit.  Wt Readings from Last 3 Encounters:  01/09/23 160 lb (72.6 kg)  08/22/22 172 lb (78 kg)  08/01/21 159 lb (72.1 kg)     There is no height or weight on file to calculate BMI.  Performance status (ECOG): 0 - Asymptomatic  PHYSICAL EXAM:   Physical Exam Vitals and nursing note reviewed. Exam conducted with a chaperone present.  Constitutional:      Appearance:  Normal appearance.  Cardiovascular:     Rate and Rhythm: Normal rate and regular rhythm.     Pulses: Normal pulses.     Heart sounds: Normal heart sounds.  Pulmonary:     Effort: Pulmonary effort is normal.     Breath sounds: Normal breath sounds.  Abdominal:     Palpations: Abdomen is soft. There is no hepatomegaly, splenomegaly or mass.     Tenderness: There is no abdominal tenderness.  Musculoskeletal:     Right lower leg: No edema.     Left lower leg: No edema.  Lymphadenopathy:     Cervical: No cervical adenopathy.     Right cervical: No superficial, deep or posterior cervical adenopathy.    Left cervical: No superficial, deep or posterior cervical adenopathy.     Upper Body:     Right upper body: No supraclavicular or axillary adenopathy.     Left upper body: No supraclavicular or axillary adenopathy.  Neurological:     General: No focal deficit present.     Mental Status: She is alert and oriented to person, place, and time.  Psychiatric:        Mood and Affect: Mood normal.        Behavior: Behavior normal.     LABS:      Latest Ref Rng & Units 03/12/2023   11:26 AM 08/02/2022    2:30 PM 01/23/2022    1:54 PM  CBC  WBC 4.0 - 10.5 K/uL 3.9  6.2  4.3   Hemoglobin 12.0 - 15.0 g/dL 9.9  96.0  45.4   Hematocrit 36.0 - 46.0 % 33.7  34.8  35.1   Platelets 150 - 400 K/uL 443  409  343       Latest Ref Rng & Units 03/12/2023   11:26 AM 08/02/2022    2:30 PM 01/23/2022    1:54 PM  CMP  Glucose 70 - 99 mg/dL 92  77  91   BUN 6 - 20 mg/dL 7  12  11    Creatinine 0.44 - 1.00 mg/dL 0.98  1.19  1.47   Sodium 135 - 145 mmol/L 136  136  137   Potassium 3.5 - 5.1 mmol/L 3.8  4.0  3.9   Chloride 98 - 111 mmol/L 103  105  107   CO2  22 - 32 mmol/L 22  23  23    Calcium 8.9 - 10.3 mg/dL 9.3  9.0  9.1   Total Protein 6.5 - 8.1 g/dL 7.4  7.0  6.9   Total Bilirubin <1.2 mg/dL 0.8  0.4  0.6   Alkaline Phos 38 - 126 U/L 57  57  53   AST 15 - 41 U/L 33  21  19   ALT 0 - 44 U/L 39  29  19      No results found for: "CEA1", "CEA" / No results found for: "CEA1", "CEA" No results found for: "PSA1" No results found for: "WGN562" No results found for: "CAN125"  No results found for: "TOTALPROTELP", "ALBUMINELP", "A1GS", "A2GS", "BETS", "BETA2SER", "GAMS", "MSPIKE", "SPEI" Lab Results  Component Value Date   TIBC 535 (H) 03/12/2023   FERRITIN 4 (L) 03/12/2023   IRONPCTSAT 5 (L) 03/12/2023   Lab Results  Component Value Date   LDH 145 06/28/2007   LDH 152 06/25/2007     STUDIES:   No results found.

## 2023-03-13 NOTE — Patient Instructions (Signed)
[facility] at [facility] Discharge Instructions   You were seen and examined today by Dr. Ellin Saba.  He reviewed the results of your lab work. Your vitamin D is down to 21. You need to restart vitamin D 5000 units daily. Your iron is very low. It is 4. Because your iron is low, it has caused your hemoglobin to drop from 11 to 9. Dr. Kirtland Bouchard would like for you to take over the counter iron tablet (325 mg) daily. You should take a stool softener daily along with the iron tablet to help prevent constipation.   Continue Tamoxifen as prescribed.   We will see you back in 6 months. We will repeat lab work prior to this visit.   Return as scheduled.    Thank you for choosing [facility] at [facility] to provide your oncology and hematology care.  To afford each patient quality time with our provider, please arrive at least 15 minutes before your scheduled appointment time.   If you have a lab appointment with the Cancer Center please come in thru the Main Entrance and check in at the main information desk.  You need to re-schedule your appointment should you arrive 10 or more minutes late.  We strive to give you quality time with our providers, and arriving late affects you and other patients whose appointments are after yours.  Also, if you no show three or more times for appointments you may be dismissed from the clinic at the providers discretion.     Again, thank you for choosing [facility].  Our hope is that these requests will decrease the amount of time that you wait before being seen by our physicians.       _____________________________________________________________  Should you have questions after your visit to [facility], please contact our office at [phone] and follow the prompts.  Our office hours are 8:00 a.m. and 4:30 p.m. Monday - Friday.  Please note that voicemails left after 4:00 p.m. may not be  returned until the following business day.  We are closed weekends and major holidays.  You do have access to a nurse 24-7, just call the main number to the clinic [phone] and do not press any options, hold on the line and a nurse will answer the phone.    For prescription refill requests, have your pharmacy contact our office and allow 72 hours.    Due to Covid, you will need to wear a mask upon entering the hospital. If you do not have a mask, a mask will be given to you at the Main Entrance upon arrival. For doctor visits, patients may have 1 support person age 37 or older with them. For treatment visits, patients can not have anyone with them due to social distancing guidelines and our immunocompromised population.

## 2023-04-23 ENCOUNTER — Encounter (HOSPITAL_COMMUNITY): Payer: Self-pay

## 2023-04-23 ENCOUNTER — Ambulatory Visit (HOSPITAL_COMMUNITY)
Admission: RE | Admit: 2023-04-23 | Discharge: 2023-04-23 | Disposition: A | Discharge status: Home / Self Care | Payer: 59 | Source: Ambulatory Visit | Attending: Hematology | Admitting: Hematology

## 2023-04-23 ENCOUNTER — Encounter (HOSPITAL_COMMUNITY): Payer: 59

## 2023-04-23 ENCOUNTER — Other Ambulatory Visit (HOSPITAL_COMMUNITY): Payer: 59

## 2023-04-23 ENCOUNTER — Ambulatory Visit (HOSPITAL_COMMUNITY)
Admission: RE | Admit: 2023-04-23 | Discharge: 2023-04-23 | Discharge status: Home / Self Care | Payer: 59 | Source: Ambulatory Visit | Attending: Hematology | Admitting: Hematology

## 2023-04-23 DIAGNOSIS — D0502 Lobular carcinoma in situ of left breast: Secondary | ICD-10-CM | POA: Diagnosis not present

## 2023-04-23 DIAGNOSIS — E559 Vitamin D deficiency, unspecified: Secondary | ICD-10-CM

## 2023-04-23 DIAGNOSIS — R92323 Mammographic fibroglandular density, bilateral breasts: Secondary | ICD-10-CM | POA: Diagnosis not present

## 2023-04-23 DIAGNOSIS — D249 Benign neoplasm of unspecified breast: Secondary | ICD-10-CM | POA: Diagnosis not present

## 2023-04-29 DIAGNOSIS — H524 Presbyopia: Secondary | ICD-10-CM | POA: Diagnosis not present

## 2023-06-03 ENCOUNTER — Other Ambulatory Visit (HOSPITAL_COMMUNITY): Payer: Self-pay

## 2023-06-10 ENCOUNTER — Other Ambulatory Visit (HOSPITAL_COMMUNITY): Payer: Self-pay

## 2023-06-10 ENCOUNTER — Other Ambulatory Visit: Payer: Self-pay

## 2023-06-10 DIAGNOSIS — R41840 Attention and concentration deficit: Secondary | ICD-10-CM | POA: Diagnosis not present

## 2023-06-10 DIAGNOSIS — G43909 Migraine, unspecified, not intractable, without status migrainosus: Secondary | ICD-10-CM | POA: Diagnosis not present

## 2023-06-10 MED ORDER — AMPHETAMINE-DEXTROAMPHETAMINE 30 MG PO TABS
30.0000 mg | ORAL_TABLET | Freq: Two times a day (BID) | ORAL | 0 refills | Status: DC
Start: 2023-06-10 — End: 2023-09-16
  Filled 2023-06-10: qty 180, 90d supply, fill #0

## 2023-06-10 MED ORDER — SUMATRIPTAN SUCCINATE 100 MG PO TABS
100.0000 mg | ORAL_TABLET | ORAL | 0 refills | Status: DC
  Filled 2023-06-10: qty 27, 90d supply, fill #0

## 2023-06-10 MED ORDER — AMPHETAMINE-DEXTROAMPHETAMINE 30 MG PO TABS
30.0000 mg | ORAL_TABLET | Freq: Every morning | ORAL | 0 refills | Status: DC
Start: 2023-06-10 — End: 2024-01-02
  Filled 2023-06-10: qty 90, 90d supply, fill #0

## 2023-06-18 ENCOUNTER — Other Ambulatory Visit (HOSPITAL_COMMUNITY): Payer: Self-pay

## 2023-08-20 ENCOUNTER — Other Ambulatory Visit (HOSPITAL_COMMUNITY): Payer: Self-pay

## 2023-08-21 ENCOUNTER — Other Ambulatory Visit (HOSPITAL_COMMUNITY): Payer: Self-pay

## 2023-09-04 ENCOUNTER — Inpatient Hospital Stay: Payer: Self-pay | Attending: Hematology

## 2023-09-04 DIAGNOSIS — D508 Other iron deficiency anemias: Secondary | ICD-10-CM

## 2023-09-04 DIAGNOSIS — D509 Iron deficiency anemia, unspecified: Secondary | ICD-10-CM | POA: Diagnosis not present

## 2023-09-04 DIAGNOSIS — C50412 Malignant neoplasm of upper-outer quadrant of left female breast: Secondary | ICD-10-CM | POA: Diagnosis not present

## 2023-09-04 DIAGNOSIS — D0502 Lobular carcinoma in situ of left breast: Secondary | ICD-10-CM

## 2023-09-04 DIAGNOSIS — E559 Vitamin D deficiency, unspecified: Secondary | ICD-10-CM | POA: Insufficient documentation

## 2023-09-04 DIAGNOSIS — Z79810 Long term (current) use of selective estrogen receptor modulators (SERMs): Secondary | ICD-10-CM | POA: Insufficient documentation

## 2023-09-04 DIAGNOSIS — Z1732 Human epidermal growth factor receptor 2 negative status: Secondary | ICD-10-CM | POA: Insufficient documentation

## 2023-09-04 DIAGNOSIS — Z1721 Progesterone receptor positive status: Secondary | ICD-10-CM | POA: Diagnosis not present

## 2023-09-04 DIAGNOSIS — Z170 Estrogen receptor positive status [ER+]: Secondary | ICD-10-CM | POA: Diagnosis not present

## 2023-09-04 LAB — CBC WITH DIFFERENTIAL/PLATELET
Abs Immature Granulocytes: 0.01 10*3/uL (ref 0.00–0.07)
Basophils Absolute: 0 10*3/uL (ref 0.0–0.1)
Basophils Relative: 1 %
Eosinophils Absolute: 0.2 10*3/uL (ref 0.0–0.5)
Eosinophils Relative: 3 %
HCT: 42.3 % (ref 36.0–46.0)
Hemoglobin: 14.3 g/dL (ref 12.0–15.0)
Immature Granulocytes: 0 %
Lymphocytes Relative: 37 %
Lymphs Abs: 2 10*3/uL (ref 0.7–4.0)
MCH: 30 pg (ref 26.0–34.0)
MCHC: 33.8 g/dL (ref 30.0–36.0)
MCV: 88.7 fL (ref 80.0–100.0)
Monocytes Absolute: 0.5 10*3/uL (ref 0.1–1.0)
Monocytes Relative: 9 %
Neutro Abs: 2.8 10*3/uL (ref 1.7–7.7)
Neutrophils Relative %: 50 %
Platelets: 330 10*3/uL (ref 150–400)
RBC: 4.77 MIL/uL (ref 3.87–5.11)
RDW: 16.5 % — ABNORMAL HIGH (ref 11.5–15.5)
WBC: 5.5 10*3/uL (ref 4.0–10.5)
nRBC: 0 % (ref 0.0–0.2)

## 2023-09-04 LAB — IRON AND TIBC
Iron: 55 ug/dL (ref 28–170)
Saturation Ratios: 13 % (ref 10.4–31.8)
TIBC: 410 ug/dL (ref 250–450)
UIBC: 355 ug/dL

## 2023-09-04 LAB — VITAMIN D 25 HYDROXY (VIT D DEFICIENCY, FRACTURES): Vit D, 25-Hydroxy: 56.05 ng/mL (ref 30–100)

## 2023-09-04 LAB — FERRITIN: Ferritin: 14 ng/mL (ref 11–307)

## 2023-09-10 NOTE — Progress Notes (Unsigned)
 [facility] [address], [place] [address]   CLINIC:  Medical Oncology/Hematology  PCP:  Practice, [facility] [address] [place] / [place] [address] [phone]   REASON FOR VISIT:  Follow-up for left breast LCIS  PRIOR THERAPY: Lumpectomy on 05/23/2020  CURRENT THERAPY: Tamoxifen   BRIEF ONCOLOGIC HISTORY:   Oncology History   No history exists.    CANCER STAGING: Cancer Staging  No matching staging information was found for the patient.   INTERVAL HISTORY:   Erika Galloway, a 48 y.o. 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 Terren was last seen on 03/13/2023 by Dr. Cheree Cords.   At today's visit, she  reports feeling ***.  She denies any recent hospitalizations, surgeries, or changes in her  baseline health status.  She denies any new breast lumps or axillary lymphadenopathy.  *** ***No other symptoms of recurrence such as new onset bone pain, chest pain, dyspnea, or abdominal pain.   ***She has no new headaches, seizures, or focal neurologic deficits.   ***No B symptoms such as fever, chills, night sweats, unintentional weight loss.  She continues to take *** TAMOXIFEN : Hot flashes, night sweats, abnormal vaginal bleeding/vaginal discharge, DVT/PE  ***Started taking iron tablet daily with stool softener after December 2024 visit. ***See has moderate bleeding from menstruation.  ***Denies rectal bleeding. *** She also donated 1 unit of blood in October. ***She reports tiredness.  *** [place]  *** She is taking vitamin D  5000 units daily.  She reports ***% energy and ***% appetite.  She is maintaining stable weight at this time.  ASSESSMENT & PLAN:  1.   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 -  Left breast biopsy, 1 o'clock position on 04/07/2020-LCIS involving a fibroadenoma. - MRI of the breast on 05/11/2020 with 6 mm enhancing mass in the posterior upper outer left breast.   No evidence of malignant cells within the breast.  No axillary lymph nodes. - Left lumpectomy on 05/23/2020 with 0.6 cm LCIS involving a fibroadenoma, LCIS focally less than 0.1 cm from lateral margin.  ER 70% positive, PR 90% positive, HER-2 negative.  No invasive component present. - Tamoxifen  started on 06/14/2020.  Goal of treatment is 5 years. Stopped briefly x 3 weeks due to headaches, but there was no improvement in headaches while off of tamoxifen , therefore restarted. - She is tolerating tamoxifen  well.  *** - Labs from 03/12/2023 showed normal LFTs. - Most recent mammogram (04/21/2023): BI-RADS Category 1, negative. - Physical exam today (09/11/2023): *** - No "red flag" symptoms per history/ROS *** - PLAN: Continue tamoxifen  daily. - RTC in 6 months for labs and follow-up.  *** - Screening mammogram due January 2026.  ***  2.   Iron deficiency anemia: - Labs from 03/12/2023 showed Hgb 9.9/MCV 76.6.  Ferritin 4, iron saturation 5% with elevated TIBC 535. - Donated 1 unit blood in October 2024. - She has moderate bleeding from menstruation.  ***Denies rectal bleeding.  *** - *** Symptoms *** - She is taking iron tablet daily with stool softener. - Most recent labs (09/04/2023): Hgb 14.3, ferritin 14, iron saturation 13%.  Thrombocytosis has resolved (likely reactive from iron deficiency) - PLAN: Continue iron tablet, but decrease to EVERY OTHER  day.  Repeat labs in 6 months.  3.  Vitamin D  deficiency: - She had low vitamin D  at 21 in December 2024, after stopping vitamin D  supplement. - She restarted vitamin D  5000 units daily in December 2024 *** - Most recent labs (09/04/2023): Vitamin D  improved/normalized at 56.05 - PLAN: Continue vitamin D , but cut back to 2000 units daily ***  4.  Social/family  history: - She works as a Sports coach at [employer].  Non-smoker. - She does not know father's side of the family.  No malignancies on her mother side.  PLAN SUMMARY: >> Labs in 6 months = CBC/D, CMP, ferritin, iron/TIBC, vitamin D  >> OFFICE visit in 6 months (1 week after labs) >> ***   REVIEW OF SYSTEMS: ***  Review of Systems - Oncology  PHYSICAL EXAM:   Performance status (ECOG): {CHL ONC GM:[id number]} *** There were no vitals filed for this visit. Wt Readings from Last 3 Encounters:  01/09/23 160 lb (72.6 kg)  08/22/22 172 lb (78 kg)  08/01/21 159 lb (72.1 kg)   Physical Exam   PAST MEDICAL/SURGICAL HISTORY:  Past Medical History:  Diagnosis Date   Abnormal Pap smear of cervix    Depression    History of COVID-19 04/19/2020   Past Surgical History:  Procedure Laterality Date   ADENOIDECTOMY     BREAST LUMPECTOMY WITH RADIOACTIVE SEED LOCALIZATION Left 05/23/2020   fibroadenoma containing LCIS/Procedure: RADIOACTIVE SEED GUIDED LEFT BREAST LUMPECTOMY;  Surgeon: Caralyn Chandler, MD;  Location: [facility];  Service: General;  Laterality: Left;    SOCIAL HISTORY:  Social History   Socioeconomic History   Marital status: Married    Spouse name: Not on file   Number of children: Not on file   Years of education: Not on file   Highest education level: Not on file  Occupational History   Not on file  Tobacco Use   Smoking status: Never   Smokeless tobacco: Never  Vaping Use   Vaping status: Never Used  Substance and Sexual Activity   Alcohol use: Never   Drug use: Never   Sexual activity: Not on file  Other Topics Concern   Not on file  Social History Narrative   Not on file   Social Drivers of Health   Financial Resource Strain: Low Risk  (05/17/2020)   Overall Financial Resource Strain (CARDIA)    Difficulty of Paying Living Expenses: Not very hard  Food Insecurity: No Food Insecurity (05/17/2020)   Hunger Vital Sign    Worried  About Running Out of Food in the Last Year: Never true    Ran Out of Food in the Last Year: Never true  Transportation Needs: No Transportation Needs (05/17/2020)   PRAPARE - Administrator, Civil Service (Medical): No    Lack of Transportation (Non-Medical): No  Physical Activity: Inactive (05/17/2020)   Exercise Vital Sign    Days of Exercise per Week: 0 days    Minutes of Exercise per Session: 0 min  Stress: No Stress Concern Present (05/17/2020)   [employer] of Occupational Health - Occupational Stress Questionnaire    Feeling of Stress : Only a little  Social Connections: Moderately Integrated (05/17/2020)   Social Connection and Isolation Panel [NHANES]    Frequency of Communication with Friends and Family: More than three times a week    Frequency of Social Gatherings with Friends and Family: Three times a week    Attends Religious Services:  1 to 4 times per year    Active Member of Clubs or Organizations: No    Attends Banker Meetings: Never    Marital Status: Married  Catering manager Violence: Not At Risk (06/12/2022)   [facility]   Humiliation, Afraid, Rape, and Kick questionnaire    Fear of Current or Ex-Partner: No    Emotionally Abused: No    Physically Abused: No    Sexually Abused: No    FAMILY HISTORY:  Family History  Problem Relation Age of Onset   Supraventricular tachycardia Mother     CURRENT MEDICATIONS:  Current Outpatient Medications  Medication Sig Dispense Refill   amphetamine -dextroamphetamine  (ADDERALL) 30 MG tablet Take 1 tablet by mouth daily in the afternoon 90 tablet 0   amphetamine -dextroamphetamine  (ADDERALL) 30 MG tablet Take 1 tablet by mouth daily. (Takes a total of 3 tablets daily) 90 tablet 0   amphetamine -dextroamphetamine  (ADDERALL) 30 MG tablet Take 1 tablet by mouth daily. (patient takes a total of 3 tablets daily) 90 tablet 0   amphetamine -dextroamphetamine  (ADDERALL) 30 MG  tablet Take 1 tablet by mouth 2 (two) times daily. (Patient takes a total of 3 tablets daily) 180 tablet 0   amphetamine -dextroamphetamine  (ADDERALL) 30 MG tablet Take 1 tablet by mouth 2 (two) times daily. 180 tablet 0   amphetamine -dextroamphetamine  (ADDERALL) 30 MG tablet Take 1 tablet by mouth every day  (total 3/day) 90 tablet 0   amphetamine -dextroamphetamine  (ADDERALL) 30 MG tablet Take 1 tablet by mouth daily. (total 3 tablets daily) 90 tablet 0   amphetamine -dextroamphetamine  (ADDERALL) 30 MG tablet Take 1 tablet by mouth 2 (two) times daily. (total 3 tablets daily) 180 tablet 0   amphetamine -dextroamphetamine  (ADDERALL) 30 MG tablet Take 1 tablet by mouth in the morning. 90 tablet 0   amphetamine -dextroamphetamine  (ADDERALL) 30 MG tablet Take 1 tablet by mouth 2 (two) times daily. 180 tablet 0   propranolol  ER (INDERAL  LA) 60 MG 24 hr capsule Take 1 capsule (60 mg total) by mouth every evening. 30 capsule 1   propranolol  ER (INDERAL  LA) 60 MG 24 hr capsule Take 1 capsule (60 mg total) by mouth every evening. 90 capsule 1   SUMAtriptan  (IMITREX ) 100 MG tablet Take 1 tablet (100 mg total) by mouth as needed. 9 tablet 1   SUMAtriptan  (IMITREX ) 100 MG tablet Take 1 tablet (100 mg total) by mouth as needed. 9 tablet 1   SUMAtriptan  (IMITREX ) 100 MG tablet Take 1 tablet (100 mg total) by mouth as needed and directed 27 tablet 0   SUMAtriptan  (IMITREX ) 100 MG tablet Take 1 tablet (100 mg total) by mouth as directed for migraines. 27 tablet 0   tamoxifen  (NOLVADEX ) 20 MG tablet Take 1 tablet (20 mg total) by mouth daily. 30 tablet 6   No current facility-administered medications for this visit.    ALLERGIES:  No Known Allergies  LABORATORY DATA:  I have reviewed the labs as listed.     Latest Ref Rng & Units 09/04/2023   11:07 AM 03/12/2023   11:26 AM 08/02/2022    2:30 PM  CBC  WBC 4.0 - 10.5 K/uL 5.5  3.9  6.2   Hemoglobin 12.0 - 15.0 g/dL 16.1  9.9  09.6   Hematocrit 36.0 - 46.0 %  42.3  33.7  34.8   Platelets 150 - 400 K/uL 330  443  409       Latest Ref Rng & Units 03/12/2023   11:26  AM 08/02/2022    2:30 PM 01/23/2022    1:54 PM  CMP  Glucose 70 - 99 mg/dL 92  77  91   BUN 6 - 20 mg/dL 7  12  11    Creatinine 0.44 - 1.00 mg/dL 1.61  0.96  0.45   Sodium 135 - 145 mmol/L 136  136  137   Potassium 3.5 - 5.1 mmol/L 3.8  4.0  3.9   Chloride 98 - 111 mmol/L 103  105  107   CO2 22 - 32 mmol/L 22  23  23    Calcium 8.9 - 10.3 mg/dL 9.3  9.0  9.1   Total Protein 6.5 - 8.1 g/dL 7.4  7.0  6.9   Total Bilirubin <1.2 mg/dL 0.8  0.4  0.6   Alkaline Phos 38 - 126 U/L 57  57  53   AST 15 - 41 U/L 33  21  19   ALT 0 - 44 U/L 39  29  19     DIAGNOSTIC IMAGING:  I have independently reviewed the scans and discussed with the patient. No results found.   WRAP UP:  All questions were answered. The patient knows to call the clinic with any problems, questions or concerns.  Medical decision making: ***  Time spent on visit: I spent {CHL ONC TIME VISIT - [id number]} counseling the patient face to face. The total time spent in the appointment was {CHL ONC TIME VISIT - OZHYQ:[id number]} and more than 50% was on counseling.  Sonnie Dusky, PA-C  ***

## 2023-09-11 ENCOUNTER — Inpatient Hospital Stay: Payer: Self-pay | Attending: Hematology | Admitting: Physician Assistant

## 2023-09-11 ENCOUNTER — Encounter: Payer: Self-pay | Admitting: Physician Assistant

## 2023-09-11 VITALS — BP 114/76 | HR 86 | Temp 98.5°F | Resp 18 | Wt 160.0 lb

## 2023-09-11 DIAGNOSIS — Z79810 Long term (current) use of selective estrogen receptor modulators (SERMs): Secondary | ICD-10-CM | POA: Insufficient documentation

## 2023-09-11 DIAGNOSIS — D500 Iron deficiency anemia secondary to blood loss (chronic): Secondary | ICD-10-CM | POA: Insufficient documentation

## 2023-09-11 DIAGNOSIS — D509 Iron deficiency anemia, unspecified: Secondary | ICD-10-CM | POA: Insufficient documentation

## 2023-09-11 DIAGNOSIS — Z79899 Other long term (current) drug therapy: Secondary | ICD-10-CM | POA: Diagnosis not present

## 2023-09-11 DIAGNOSIS — C50412 Malignant neoplasm of upper-outer quadrant of left female breast: Secondary | ICD-10-CM | POA: Diagnosis not present

## 2023-09-11 DIAGNOSIS — Z1721 Progesterone receptor positive status: Secondary | ICD-10-CM | POA: Insufficient documentation

## 2023-09-11 DIAGNOSIS — D0502 Lobular carcinoma in situ of left breast: Secondary | ICD-10-CM | POA: Diagnosis not present

## 2023-09-11 DIAGNOSIS — E559 Vitamin D deficiency, unspecified: Secondary | ICD-10-CM | POA: Insufficient documentation

## 2023-09-11 DIAGNOSIS — Z170 Estrogen receptor positive status [ER+]: Secondary | ICD-10-CM | POA: Insufficient documentation

## 2023-09-11 NOTE — Patient Instructions (Addendum)
 [facility] at [facility] **VISIT SUMMARY & IMPORTANT INSTRUCTIONS **   You were seen today by Sheril Dines PA-C for your history of left breast cancer (LCIS / "stage 0" breast cancer).  You did not have any evidence of recurrent breast cancer based on your most recent labs, mammogram, and physical exam.   Continue tamoxifen  daily.  Seek medical attention if you have any abnormal vaginal bleeding or discharge, or if you experience any symptoms concerning for a possible blood clot. Will see you for repeat labs at follow-up visit in 6 months (December 2025). Your next mammogram will be due in January 2026.  IRON DEFICIENCY ANEMIA: Your blood levels look much better - you are no longer anemic! Your iron levels are still low, which may be causing some of your fatigue.  We will schedule you for IV iron x 1 dose.  Please see the attached handout for important information regarding IV iron. Continue taking your iron tablet at home, but decrease this to take every other day (instead of daily).  Take with a stool softener to avoid constipation.  VITAMIN D  DEFICIENCY: Your vitamin D  levels look much better!  You can cut back to taking vitamin D  2000 units daily, which is available over-the-counter.   FOLLOW-UP APPOINTMENT: Labs and office visit in 6 months.  ** Thank you for trusting me with your healthcare!  I strive to provide all of my patients with quality care at each visit.  If you receive a survey for this visit, I would be so grateful to you for taking the time to provide feedback.  Thank you in advance!  ~ Charisa Twitty                   Dr. Paulett Boros   &   Sheril Dines, PA-C   - - - - - - - - - - - - - - - - - -    Thank you for choosing [facility] at [facility] to provide your oncology and hematology care.  To afford each patient quality time with our provider, please arrive at least 15 minutes before your scheduled appointment  time.   If you have a lab appointment with the Cancer Center please come in thru the Main Entrance and check in at the main information desk.  You need to re-schedule your appointment should you arrive 10 or more minutes late.  We strive to give you quality time with our providers, and arriving late affects you and other patients whose appointments are after yours.  Also, if you no show three or more times for appointments you may be dismissed from the clinic at the providers discretion.     Again, thank you for choosing [facility].  Our hope is that these requests will decrease the amount of time that you wait before being seen by our physicians.       _____________________________________________________________  Should you have questions after your visit to [facility], please contact our office at [phone] and follow the prompts.  Our office hours are 8:00 a.m. and 4:30 p.m. Monday - Friday.  Please note that voicemails left after 4:00 p.m. may not be returned until the following business day.  We are closed weekends and major holidays.  You do have access to a nurse 24-7, just call the main number to the clinic [phone] and do not press any options, hold on the line  and a nurse will answer the phone.    For prescription refill requests, have your pharmacy contact our office and allow 72 hours.

## 2023-09-13 ENCOUNTER — Other Ambulatory Visit: Payer: Self-pay

## 2023-09-13 ENCOUNTER — Other Ambulatory Visit (HOSPITAL_COMMUNITY): Payer: Self-pay

## 2023-09-16 ENCOUNTER — Encounter: Payer: Self-pay | Admitting: Physician Assistant

## 2023-09-16 ENCOUNTER — Other Ambulatory Visit (HOSPITAL_COMMUNITY): Payer: Self-pay

## 2023-09-16 MED ORDER — AMPHETAMINE-DEXTROAMPHETAMINE 30 MG PO TABS
30.0000 mg | ORAL_TABLET | Freq: Two times a day (BID) | ORAL | 0 refills | Status: DC
  Filled 2023-09-16 – 2023-09-26 (×2): qty 180, 90d supply, fill #0

## 2023-09-16 MED ORDER — AMPHETAMINE-DEXTROAMPHETAMINE 30 MG PO TABS
30.0000 mg | ORAL_TABLET | Freq: Every day | ORAL | 0 refills | Status: DC
Start: 2023-09-14 — End: 2024-01-02
  Filled 2023-09-16: qty 90, 90d supply, fill #0
  Filled 2023-09-26: qty 90, 30d supply, fill #0
  Filled 2023-09-26: qty 90, 90d supply, fill #0

## 2023-09-16 MED ORDER — SUMATRIPTAN SUCCINATE 100 MG PO TABS
100.0000 mg | ORAL_TABLET | ORAL | 3 refills | Status: DC | PRN
  Filled 2023-09-16: qty 9, 30d supply, fill #0

## 2023-09-17 DIAGNOSIS — Z6826 Body mass index (BMI) 26.0-26.9, adult: Secondary | ICD-10-CM | POA: Diagnosis not present

## 2023-09-17 DIAGNOSIS — Z01419 Encounter for gynecological examination (general) (routine) without abnormal findings: Secondary | ICD-10-CM | POA: Diagnosis not present

## 2023-09-17 DIAGNOSIS — Z1211 Encounter for screening for malignant neoplasm of colon: Secondary | ICD-10-CM | POA: Diagnosis not present

## 2023-09-20 ENCOUNTER — Inpatient Hospital Stay

## 2023-09-26 ENCOUNTER — Other Ambulatory Visit (HOSPITAL_COMMUNITY): Payer: Self-pay

## 2023-09-26 ENCOUNTER — Other Ambulatory Visit: Payer: Self-pay

## 2023-09-26 ENCOUNTER — Encounter: Payer: Self-pay | Admitting: Physician Assistant

## 2023-09-26 MED ORDER — AMPHETAMINE-DEXTROAMPHETAMINE 30 MG PO TABS
1.0000 | ORAL_TABLET | Freq: Every morning | ORAL | 0 refills | Status: DC
  Filled 2023-09-26: qty 90, 90d supply, fill #0

## 2023-10-03 DIAGNOSIS — Z1211 Encounter for screening for malignant neoplasm of colon: Secondary | ICD-10-CM | POA: Diagnosis not present

## 2023-10-04 ENCOUNTER — Inpatient Hospital Stay

## 2023-10-07 ENCOUNTER — Other Ambulatory Visit (HOSPITAL_COMMUNITY): Payer: Self-pay

## 2023-10-09 LAB — COLOGUARD: COLOGUARD: NEGATIVE

## 2023-11-14 ENCOUNTER — Other Ambulatory Visit: Payer: Self-pay | Admitting: Hematology

## 2023-11-14 ENCOUNTER — Other Ambulatory Visit (HOSPITAL_COMMUNITY): Payer: Self-pay

## 2023-11-14 DIAGNOSIS — D0502 Lobular carcinoma in situ of left breast: Secondary | ICD-10-CM

## 2023-11-15 ENCOUNTER — Encounter: Payer: Self-pay | Admitting: Pharmacist

## 2023-11-15 ENCOUNTER — Other Ambulatory Visit (HOSPITAL_COMMUNITY): Payer: Self-pay

## 2023-11-15 ENCOUNTER — Encounter: Payer: Self-pay | Admitting: Physician Assistant

## 2023-11-15 ENCOUNTER — Other Ambulatory Visit: Payer: Self-pay

## 2023-11-15 MED ORDER — PROPRANOLOL HCL ER 60 MG PO CP24
60.0000 mg | ORAL_CAPSULE | Freq: Every evening | ORAL | 1 refills | Status: DC
  Filled 2023-11-15: qty 90, 90d supply, fill #0

## 2023-11-15 MED ORDER — TAMOXIFEN CITRATE 20 MG PO TABS
20.0000 mg | ORAL_TABLET | Freq: Every day | ORAL | 11 refills | Status: AC
  Filled 2023-11-15: qty 30, 30d supply, fill #0
  Filled 2024-01-07 – 2024-01-08 (×2): qty 30, 30d supply, fill #1
  Filled 2024-03-17: qty 30, 30d supply, fill #2
  Filled 2024-04-29: qty 90, 90d supply, fill #3

## 2023-11-19 ENCOUNTER — Other Ambulatory Visit (HOSPITAL_COMMUNITY): Payer: Self-pay

## 2023-11-19 MED ORDER — PROPRANOLOL HCL ER 60 MG PO CP24
60.0000 mg | ORAL_CAPSULE | Freq: Every morning | ORAL | 1 refills | Status: DC
  Filled 2023-11-19: qty 90, 90d supply, fill #0

## 2023-11-20 ENCOUNTER — Other Ambulatory Visit: Payer: Self-pay

## 2023-12-19 ENCOUNTER — Encounter: Payer: Self-pay | Admitting: Pharmacist

## 2023-12-19 ENCOUNTER — Other Ambulatory Visit (HOSPITAL_COMMUNITY): Payer: Self-pay

## 2023-12-19 ENCOUNTER — Other Ambulatory Visit: Payer: Self-pay

## 2023-12-19 DIAGNOSIS — R41840 Attention and concentration deficit: Secondary | ICD-10-CM | POA: Diagnosis not present

## 2023-12-19 DIAGNOSIS — G43909 Migraine, unspecified, not intractable, without status migrainosus: Secondary | ICD-10-CM | POA: Diagnosis not present

## 2023-12-19 MED ORDER — AMPHETAMINE-DEXTROAMPHETAMINE 30 MG PO TABS
90.0000 mg | ORAL_TABLET | Freq: Every day | ORAL | 0 refills | Status: DC
  Filled 2023-12-19: qty 90, 45d supply, fill #0
  Filled 2023-12-23 – 2023-12-24 (×2): qty 90, 90d supply, fill #0
  Filled 2024-01-07 – 2024-01-08 (×2): qty 90, 30d supply, fill #0

## 2023-12-19 MED ORDER — SUMATRIPTAN SUCCINATE 100 MG PO TABS
100.0000 mg | ORAL_TABLET | Freq: Every day | ORAL | 3 refills | Status: AC | PRN
  Filled 2023-12-19 – 2024-01-08 (×3): qty 9, 30d supply, fill #0

## 2023-12-19 MED ORDER — PROPRANOLOL HCL ER 60 MG PO CP24
60.0000 mg | ORAL_CAPSULE | Freq: Every evening | ORAL | 1 refills | Status: AC
  Filled 2023-12-19 – 2024-01-08 (×3): qty 90, 90d supply, fill #0
  Filled 2024-04-29: qty 90, 90d supply, fill #1

## 2023-12-19 MED ORDER — AMPHETAMINE-DEXTROAMPHETAMINE 30 MG PO TABS
30.0000 mg | ORAL_TABLET | Freq: Two times a day (BID) | ORAL | 0 refills | Status: DC
  Filled 2023-12-19 – 2023-12-24 (×3): qty 180, 90d supply, fill #0

## 2023-12-23 ENCOUNTER — Other Ambulatory Visit (HOSPITAL_COMMUNITY): Payer: Self-pay

## 2023-12-24 ENCOUNTER — Other Ambulatory Visit (HOSPITAL_COMMUNITY): Payer: Self-pay

## 2023-12-24 ENCOUNTER — Other Ambulatory Visit: Payer: Self-pay

## 2023-12-25 ENCOUNTER — Other Ambulatory Visit: Payer: Self-pay

## 2023-12-30 ENCOUNTER — Other Ambulatory Visit: Payer: Self-pay

## 2024-01-02 ENCOUNTER — Inpatient Hospital Stay: Attending: Physician Assistant

## 2024-01-02 VITALS — BP 114/88 | HR 87 | Temp 98.4°F | Resp 18 | Wt 163.0 lb

## 2024-01-02 DIAGNOSIS — Z79810 Long term (current) use of selective estrogen receptor modulators (SERMs): Secondary | ICD-10-CM | POA: Diagnosis not present

## 2024-01-02 DIAGNOSIS — D509 Iron deficiency anemia, unspecified: Secondary | ICD-10-CM | POA: Diagnosis not present

## 2024-01-02 DIAGNOSIS — E559 Vitamin D deficiency, unspecified: Secondary | ICD-10-CM | POA: Insufficient documentation

## 2024-01-02 DIAGNOSIS — D500 Iron deficiency anemia secondary to blood loss (chronic): Secondary | ICD-10-CM

## 2024-01-02 DIAGNOSIS — D0502 Lobular carcinoma in situ of left breast: Secondary | ICD-10-CM | POA: Diagnosis not present

## 2024-01-02 MED ORDER — IRON SUCROSE 400 MG IVPB - SIMPLE MED
400.0000 mg | Freq: Once | Status: AC
  Administered 2024-01-02: 400 mg via INTRAVENOUS
  Filled 2024-01-02: qty 400

## 2024-01-02 MED ORDER — SODIUM CHLORIDE 0.9 % IV SOLN: INTRAVENOUS | Status: DC

## 2024-01-02 NOTE — Progress Notes (Signed)
 Patient presents today for first time iron  infusion of Venofer .  Patient is in satisfactory condition with no new complaints voiced.  Vital signs are stable. Patient reports taking Tylenol  650mg  and Zyrtec 10mg  prior to arrival at 8:15am this morning.  We will proceed with infusion per provider orders.    Patient c/o swelling in feet bilaterally as post time complete. Denies any other symptoms. Patient VSS. Delon Hope PA and Dr Davonna made aware. Patient assessed by Delon burns. Approval given to discharge patient without further intervention. Patient aware of signs and symptoms to watch for and report or be seen for, verbalized understanding.  Patient left ambulatory in stable condition.  Vital signs stable at discharge.  Follow up as scheduled.

## 2024-01-02 NOTE — Patient Instructions (Signed)
 [facility] [place] - A DEPT OF [facility]  [facility]  Discharge Instructions: Thank you for choosing [facility] to provide your oncology and hematology care.  If you have a lab appointment with the Cancer Center - please note that after April 8th, 2024, all labs will be drawn in the cancer center.  You do not have to check in or register with the main entrance as you have in the past but will complete your check-in in the cancer center.  Wear comfortable clothing and clothing appropriate for easy access to any Portacath or PICC line.   We strive to give you quality time with your provider. You may need to reschedule your appointment if you arrive late (15 or more minutes).  Arriving late affects you and other patients whose appointments are after yours.  Also, if you miss three or more appointments without notifying the office, you may be dismissed from the clinic at the provider's discretion.      For prescription refill requests, have your pharmacy contact our office and allow 72 hours for refills to be completed.    Today you received the following Venofer  iron  infusion.    Iron  Sucrose Injection What is this medication? IRON  SUCROSE (EYE ern SOO krose) treats low levels of iron  (iron  deficiency anemia) in people with kidney disease. Iron  is a mineral that plays an important role in making red blood cells, which carry oxygen  from your lungs to the rest of your body. This medicine may be used for other purposes; ask your health care provider or pharmacist if you have questions. COMMON BRAND NAME(S): Venofer  What should I tell my care team before I take this medication? They need to know if you have any of these conditions: Anemia not caused by low iron  levels Heart disease High levels of iron  in the blood Kidney disease Liver disease An unusual or allergic reaction to iron , other medications, foods, dyes, or preservatives Pregnant or trying to get  pregnant Breastfeeding How should I use this medication? This medication is infused into a vein. It is given by your care team in a hospital or clinic setting. Talk to your care team about the use of this medication in children. While it may be prescribed for children as young as 2 years for selected conditions, precautions do apply. Overdosage: If you think you have taken too much of this medicine contact a poison control center or emergency room at once. NOTE: This medicine is only for you. Do not share this medicine with others. What if I miss a dose? Keep appointments for follow-up doses. It is important not to miss your dose. Call your care team if you are unable to keep an appointment. What may interact with this medication? Do not take this medication with any of the following: Deferoxamine Dimercaprol Other iron  products This medication may also interact with the following: Chloramphenicol Deferasirox This list may not describe all possible interactions. Give your health care provider a list of all the medicines, herbs, non-prescription drugs, or dietary supplements you use. Also tell them if you smoke, drink alcohol, or use illegal drugs. Some items may interact with your medicine. What should I watch for while using this medication? Visit your care team for regular checks on your progress. Tell your care team if your symptoms do not start to get better or if they get worse. You may need blood work done while you are taking this medication. You may need to  eat more foods that contain iron . Talk to your care team. Foods that contain iron  include whole grains or cereals, dried fruits, beans, peas, leafy green vegetables, and organ meats (liver, kidney). What side effects may I notice from receiving this medication? Side effects that you should report to your care team as soon as possible: Allergic reactions--skin rash, itching, hives, swelling of the face, lips, tongue, or throat Low  blood pressure--dizziness, feeling faint or lightheaded, blurry vision Shortness of breath Side effects that usually do not require medical attention (report to your care team if they continue or are bothersome): Flushing Headache Joint pain Muscle pain Nausea Pain, redness, or irritation at injection site This list may not describe all possible side effects. Call your doctor for medical advice about side effects. You may report side effects to FDA at 1-800-FDA-1088. Where should I keep my medication? This medication is given in a hospital or clinic. It will not be stored at home. NOTE: This sheet is a summary. It may not cover all possible information. If you have questions about this medicine, talk to your doctor, pharmacist, or health care provider.  2024 Elsevier/Gold Standard (2022-11-14 00:00:00)   To help prevent nausea and vomiting after your treatment, we encourage you to take your nausea medication as directed.  BELOW ARE SYMPTOMS THAT SHOULD BE REPORTED IMMEDIATELY: *FEVER GREATER THAN 100.4 F (38 C) OR HIGHER *CHILLS OR SWEATING *NAUSEA AND VOMITING THAT IS NOT CONTROLLED WITH YOUR NAUSEA MEDICATION *UNUSUAL SHORTNESS OF BREATH *UNUSUAL BRUISING OR BLEEDING *URINARY PROBLEMS (pain or burning when urinating, or frequent urination) *BOWEL PROBLEMS (unusual diarrhea, constipation, pain near the anus) TENDERNESS IN MOUTH AND THROAT WITH OR WITHOUT PRESENCE OF ULCERS (sore throat, sores in mouth, or a toothache) UNUSUAL RASH, SWELLING OR PAIN  UNUSUAL VAGINAL DISCHARGE OR ITCHING   Items with * indicate a potential emergency and should be followed up as soon as possible or go to the Emergency Department if any problems should occur.  Please show the CHEMOTHERAPY ALERT CARD or IMMUNOTHERAPY ALERT CARD at check-in to the Emergency Department and triage nurse.  Should you have questions after your visit or need to cancel or reschedule your appointment, please contact [facility] [place] - A DEPT OF [facility] [phone]  and follow the prompts.  Office hours are 8:00 a.m. to 4:30 p.m. Monday - Friday. Please note that voicemails left after 4:00 p.m. may not be returned until the following business day.  We are closed weekends and major holidays. You have access to a nurse at all times for urgent questions. Please call the main number to the clinic [phone] and follow the prompts.  For any non-urgent questions, you may also contact your provider using MyChart. We now offer e-Visits for anyone 25 and older to request care online for non-urgent symptoms. For details visit [email or web address].   Also download the MyChart app! Go to the app store, search MyChart, open the app, select [place], and log in with your MyChart username and password.

## 2024-01-07 ENCOUNTER — Other Ambulatory Visit (HOSPITAL_COMMUNITY): Payer: Self-pay

## 2024-01-07 ENCOUNTER — Encounter: Payer: Self-pay | Admitting: Physician Assistant

## 2024-01-07 ENCOUNTER — Other Ambulatory Visit: Payer: Self-pay

## 2024-01-08 ENCOUNTER — Encounter: Payer: Self-pay | Admitting: Physician Assistant

## 2024-01-08 ENCOUNTER — Other Ambulatory Visit: Payer: Self-pay

## 2024-01-08 ENCOUNTER — Other Ambulatory Visit (HOSPITAL_COMMUNITY): Payer: Self-pay

## 2024-01-14 ENCOUNTER — Other Ambulatory Visit (HOSPITAL_COMMUNITY): Payer: Self-pay

## 2024-01-16 ENCOUNTER — Other Ambulatory Visit (HOSPITAL_COMMUNITY): Payer: Self-pay

## 2024-01-17 ENCOUNTER — Other Ambulatory Visit (HOSPITAL_COMMUNITY): Payer: Self-pay

## 2024-02-12 ENCOUNTER — Other Ambulatory Visit (HOSPITAL_COMMUNITY): Payer: Self-pay

## 2024-02-13 ENCOUNTER — Encounter: Payer: Self-pay | Admitting: Physician Assistant

## 2024-02-13 ENCOUNTER — Other Ambulatory Visit (HOSPITAL_COMMUNITY): Payer: Self-pay

## 2024-02-13 MED ORDER — AMPHETAMINE-DEXTROAMPHETAMINE 30 MG PO TABS
30.0000 mg | ORAL_TABLET | Freq: Three times a day (TID) | ORAL | 0 refills | Status: AC
  Filled 2024-02-13: qty 270, 90d supply, fill #0

## 2024-02-14 ENCOUNTER — Other Ambulatory Visit (HOSPITAL_COMMUNITY): Payer: Self-pay

## 2024-02-14 ENCOUNTER — Encounter: Payer: Self-pay | Admitting: Physician Assistant

## 2024-02-20 ENCOUNTER — Encounter: Payer: Self-pay | Admitting: Physician Assistant

## 2024-02-20 ENCOUNTER — Other Ambulatory Visit (HOSPITAL_BASED_OUTPATIENT_CLINIC_OR_DEPARTMENT_OTHER): Payer: Self-pay

## 2024-02-20 ENCOUNTER — Other Ambulatory Visit (HOSPITAL_COMMUNITY): Payer: Self-pay

## 2024-03-11 ENCOUNTER — Inpatient Hospital Stay: Attending: Physician Assistant

## 2024-03-18 ENCOUNTER — Inpatient Hospital Stay: Admitting: Physician Assistant

## 2024-04-06 ENCOUNTER — Encounter: Payer: Self-pay | Admitting: *Deleted

## 2024-04-27 ENCOUNTER — Other Ambulatory Visit (HOSPITAL_COMMUNITY): Payer: Self-pay

## 2024-04-27 MED ORDER — SUMATRIPTAN SUCCINATE 100 MG PO TABS
100.0000 mg | ORAL_TABLET | ORAL | 0 refills | Status: AC
  Filled 2024-04-27 – 2024-04-28 (×3): qty 18, 30d supply, fill #0

## 2024-04-28 ENCOUNTER — Other Ambulatory Visit: Payer: Self-pay

## 2024-05-01 ENCOUNTER — Other Ambulatory Visit (HOSPITAL_COMMUNITY): Payer: Self-pay

## 2024-05-04 ENCOUNTER — Other Ambulatory Visit (HOSPITAL_COMMUNITY): Payer: Self-pay

## 2024-05-05 ENCOUNTER — Other Ambulatory Visit: Payer: Self-pay
